# Patient Record
Sex: Female | Born: 1960 | Race: White | Hispanic: No | Marital: Married | Smoking: Never smoker
Health system: Southern US, Community
[De-identification: ages and names within clinical notes are randomized; demographics above are authoritative.]

## PROBLEM LIST (undated history)

## (undated) DIAGNOSIS — I10 Essential (primary) hypertension: Secondary | ICD-10-CM

## (undated) DIAGNOSIS — R569 Unspecified convulsions: Secondary | ICD-10-CM

## (undated) DIAGNOSIS — D689 Coagulation defect, unspecified: Secondary | ICD-10-CM

## (undated) DIAGNOSIS — M1990 Unspecified osteoarthritis, unspecified site: Secondary | ICD-10-CM

## (undated) DIAGNOSIS — K449 Diaphragmatic hernia without obstruction or gangrene: Secondary | ICD-10-CM

## (undated) DIAGNOSIS — T7840XA Allergy, unspecified, initial encounter: Secondary | ICD-10-CM

## (undated) DIAGNOSIS — F419 Anxiety disorder, unspecified: Secondary | ICD-10-CM

## (undated) DIAGNOSIS — K219 Gastro-esophageal reflux disease without esophagitis: Secondary | ICD-10-CM

## (undated) HISTORY — DX: Essential (primary) hypertension: I10

## (undated) HISTORY — DX: Gastro-esophageal reflux disease without esophagitis: K21.9

## (undated) HISTORY — DX: Unspecified osteoarthritis, unspecified site: M19.90

## (undated) HISTORY — DX: Unspecified convulsions: R56.9

## (undated) HISTORY — PX: UPPER GASTROINTESTINAL ENDOSCOPY: SHX188

## (undated) HISTORY — DX: Diaphragmatic hernia without obstruction or gangrene: K44.9

## (undated) HISTORY — DX: Anxiety disorder, unspecified: F41.9

## (undated) HISTORY — DX: Coagulation defect, unspecified: D68.9

## (undated) HISTORY — PX: COLONOSCOPY: SHX174

## (undated) HISTORY — DX: Allergy, unspecified, initial encounter: T78.40XA

---

## 1997-09-13 HISTORY — PX: BACK SURGERY: SHX140

## 2009-01-13 ENCOUNTER — Encounter: Admission: RE | Admit: 2009-01-13 | Discharge: 2009-01-13 | Payer: Self-pay | Admitting: Obstetrics

## 2009-07-18 ENCOUNTER — Ambulatory Visit: Payer: Self-pay | Admitting: Genetic Counselor

## 2009-08-25 ENCOUNTER — Ambulatory Visit: Payer: Self-pay | Admitting: Genetic Counselor

## 2009-09-13 HISTORY — PX: ABDOMINAL HYSTERECTOMY: SHX81

## 2010-02-11 ENCOUNTER — Encounter (INDEPENDENT_AMBULATORY_CARE_PROVIDER_SITE_OTHER): Payer: Self-pay | Admitting: Obstetrics

## 2010-02-11 ENCOUNTER — Inpatient Hospital Stay (HOSPITAL_COMMUNITY): Admission: RE | Admit: 2010-02-11 | Discharge: 2010-02-13 | Payer: Self-pay | Admitting: Obstetrics

## 2010-03-09 ENCOUNTER — Encounter: Admission: RE | Admit: 2010-03-09 | Discharge: 2010-03-09 | Payer: Self-pay | Admitting: Obstetrics

## 2010-11-30 LAB — BASIC METABOLIC PANEL
CO2: 26 mEq/L (ref 19–32)
Calcium: 9.9 mg/dL (ref 8.4–10.5)
Chloride: 105 mEq/L (ref 96–112)
Glucose, Bld: 92 mg/dL (ref 70–99)

## 2010-11-30 LAB — CBC
HCT: 34.6 % — ABNORMAL LOW (ref 36.0–46.0)
HCT: 43.1 % (ref 36.0–46.0)
Hemoglobin: 12.2 g/dL (ref 12.0–15.0)
Hemoglobin: 14.9 g/dL (ref 12.0–15.0)
MCHC: 35.2 g/dL (ref 30.0–36.0)
MCHC: 34.5 g/dL (ref 30.0–36.0)
MCV: 87.8 fL (ref 78.0–100.0)
MCV: 87.7 fL (ref 78.0–100.0)
Platelets: 213 10*3/uL (ref 150–400)
Platelets: 237 10*3/uL (ref 150–400)
RBC: 3.94 MIL/uL (ref 3.87–5.11)
RBC: 4.92 MIL/uL (ref 3.87–5.11)
RDW: 12.4 % (ref 11.5–15.5)

## 2010-11-30 LAB — PREGNANCY, URINE: Preg Test, Ur: NEGATIVE

## 2010-11-30 LAB — TYPE AND SCREEN
ABO/RH(D): A NEG
Antibody Screen: NEGATIVE

## 2010-11-30 LAB — ABO/RH: ABO/RH(D): A NEG

## 2011-02-02 ENCOUNTER — Other Ambulatory Visit: Payer: Self-pay | Admitting: Obstetrics

## 2011-02-02 DIAGNOSIS — Z1231 Encounter for screening mammogram for malignant neoplasm of breast: Secondary | ICD-10-CM

## 2011-02-24 ENCOUNTER — Other Ambulatory Visit: Payer: Self-pay | Admitting: Internal Medicine

## 2011-02-24 ENCOUNTER — Ambulatory Visit
Admission: RE | Admit: 2011-02-24 | Discharge: 2011-02-24 | Disposition: A | Discharge status: Home / Self Care | Payer: BC Managed Care – PPO | Source: Ambulatory Visit | Attending: Internal Medicine | Admitting: Internal Medicine

## 2011-02-24 DIAGNOSIS — R112 Nausea with vomiting, unspecified: Secondary | ICD-10-CM

## 2011-03-18 ENCOUNTER — Ambulatory Visit
Admission: RE | Admit: 2011-03-18 | Discharge: 2011-03-18 | Disposition: A | Discharge status: Home / Self Care | Payer: BC Managed Care – PPO | Source: Ambulatory Visit | Attending: Obstetrics | Admitting: Obstetrics

## 2011-03-18 DIAGNOSIS — Z1231 Encounter for screening mammogram for malignant neoplasm of breast: Secondary | ICD-10-CM

## 2012-01-03 ENCOUNTER — Encounter: Payer: Self-pay | Admitting: Internal Medicine

## 2012-02-10 ENCOUNTER — Ambulatory Visit (AMBULATORY_SURGERY_CENTER): Payer: BC Managed Care – PPO | Admitting: *Deleted

## 2012-02-10 ENCOUNTER — Encounter: Payer: Self-pay | Admitting: Internal Medicine

## 2012-02-10 VITALS — Ht 65.0 in | Wt 152.0 lb

## 2012-02-10 DIAGNOSIS — Z1211 Encounter for screening for malignant neoplasm of colon: Secondary | ICD-10-CM

## 2012-02-10 MED ORDER — PEG-KCL-NACL-NASULF-NA ASC-C 100 G PO SOLR: ORAL | Status: DC

## 2012-02-15 ENCOUNTER — Other Ambulatory Visit: Payer: Self-pay | Admitting: Internal Medicine

## 2012-02-15 DIAGNOSIS — Z1231 Encounter for screening mammogram for malignant neoplasm of breast: Secondary | ICD-10-CM

## 2012-02-21 ENCOUNTER — Encounter: Payer: Self-pay | Admitting: Internal Medicine

## 2012-02-21 ENCOUNTER — Ambulatory Visit (AMBULATORY_SURGERY_CENTER): Payer: BC Managed Care – PPO | Admitting: Internal Medicine

## 2012-02-21 VITALS — BP 182/86 | HR 77 | Temp 98.9°F | Resp 20 | Ht 65.0 in | Wt 152.0 lb

## 2012-02-21 DIAGNOSIS — D126 Benign neoplasm of colon, unspecified: Secondary | ICD-10-CM

## 2012-02-21 DIAGNOSIS — Z1211 Encounter for screening for malignant neoplasm of colon: Secondary | ICD-10-CM

## 2012-02-21 MED ORDER — SODIUM CHLORIDE 0.9 % IV SOLN: 500.0000 mL | INTRAVENOUS | Status: DC

## 2012-02-21 NOTE — Progress Notes (Signed)
Patient did not have preoperative order for IV antibiotic SSI prophylaxis. (G8918)  Patient did not experience any of the following events: a burn prior to discharge; a fall within the facility; wrong site/side/patient/procedure/implant event; or a hospital transfer or hospital admission upon discharge from the facility. (G8907)  

## 2012-02-21 NOTE — Op Note (Signed)
[facility] [address] [place], [place]  [address]  COLONOSCOPY PROCEDURE REPORT  PATIENT:  Melinda, Wheeler  MR#:  [id number] BIRTHDATE:  20-Dec-1960, 51 yrs. old  GENDER:  female ENDOSCOPIST:  Wilhemina Bonito. Eda Keys, MD REF. BY:  Chilton Greathouse, M.D. PROCEDURE DATE:  02/21/2012 PROCEDURE:  Colonoscopy with snare polypectomy x 1 ASA CLASS:  Class II INDICATIONS:  Routine Risk Screening MEDICATIONS:   MAC sedation, administered by CRNA, propofol (Diprivan) 300 mg IV  DESCRIPTION OF PROCEDURE:   After the risks benefits and alternatives of the procedure were thoroughly explained, informed consent was obtained.  Digital rectal exam was performed and revealed no abnormalities.   The LB CF-H180AL [id number] endoscope was introduced through the anus and advanced to the cecum, which was identified by both the appendix and ileocecal valve, without limitations.  The quality of the prep was excellent, using MoviPrep.  The instrument was then slowly withdrawn as the colon was fully examined. <<PROCEDUREIMAGES>>  FINDINGS:  A diminutive polyp was found in the ascending colon and snared without cautery. Retrieval was successful. Otherwise normal colonoscopy without other polyps, masses, vascular ectasias, or inflammatory changes.   Retroflexed views in the rectum revealed no abnormalities.    The time to cecum = 3:35  minutes. The scope was then withdrawn in 12:58  minutes from the cecum and the procedure completed.  COMPLICATIONS:  None  ENDOSCOPIC IMPRESSION: 1) Diminutive polyp in the ascending colon - removed 2) Otherwise normal colonoscopy  RECOMMENDATIONS: 1) Repeat colonoscopy in 5 years if polyp adenomatous; otherwise 10 years  ______________________________ Wilhemina Bonito. Eda Keys, MD  CC:  Chilton Greathouse, MD;  The Patient  n. eSIGNED:   Wilhemina Bonito. Eda Keys at 02/21/2012 11:32 AM  Christy Sartorius, [phone]

## 2012-02-21 NOTE — Patient Instructions (Signed)
YOU HAD AN ENDOSCOPIC PROCEDURE TODAY AT THE [facility]: Refer to the procedure report that was given to you for any specific questions about what was found during the examination.  If the procedure report does not answer your questions, please call your gastroenterologist to clarify.  If you requested that your care partner not be given the details of your procedure findings, then the procedure report has been included in a sealed envelope for you to review at your convenience later.  YOU SHOULD EXPECT: Some feelings of bloating in the abdomen. Passage of more gas than usual.  Walking can help get rid of the air that was put into your GI tract during the procedure and reduce the bloating. If you had a lower endoscopy (such as a colonoscopy or flexible sigmoidoscopy) you may notice spotting of blood in your stool or on the toilet paper. If you underwent a bowel prep for your procedure, then you may not have a normal bowel movement for a few days.  DIET: Your first meal following the procedure should be a light meal and then it is ok to progress to your normal diet.  A half-sandwich or bowl of soup is an example of a good first meal.  Heavy or fried foods are harder to digest and may make you feel nauseous or bloated.  Likewise meals heavy in dairy and vegetables can cause extra gas to form and this can also increase the bloating.  Drink plenty of fluids but you should avoid alcoholic beverages for 24 hours.  ACTIVITY: Your care partner should take you home directly after the procedure.  You should plan to take it easy, moving slowly for the rest of the day.  You can resume normal activity the day after the procedure however you should NOT DRIVE or use heavy machinery for 24 hours (because of the sedation medicines used during the test).    SYMPTOMS TO REPORT IMMEDIATELY: A gastroenterologist can be reached at any hour.  During normal business hours, 8:30 AM to 5:00 PM Monday through Friday,  call [phone].  After hours and on weekends, please call the GI answering service at [phone] who will take a message and have the physician on call contact you.   Following lower endoscopy (colonoscopy or flexible sigmoidoscopy):  Excessive amounts of blood in the stool  Significant tenderness or worsening of abdominal pains  Swelling of the abdomen that is new, acute  Fever of 100F or higher   FOLLOW UP: If any biopsies were taken you will be contacted by phone or by letter within the next 1-3 weeks.  Call your gastroenterologist if you have not heard about the biopsies in 3 weeks.  Our staff will call the home number listed on your records the next business day following your procedure to check on you and address any questions or concerns that you may have at that time regarding the information given to you following your procedure. This is a courtesy call and so if there is no answer at the home number and we have not heard from you through the emergency physician on call, we will assume that you have returned to your regular daily activities without incident.  SIGNATURES/CONFIDENTIALITY: You and/or your care partner have signed paperwork which will be entered into your electronic medical record.  These signatures attest to the fact that that the information above on your After Visit Summary has been reviewed and is understood.  Full responsibility of the confidentiality of   this discharge information lies with you and/or your care-partner.   INFORMATION ON POLYPS GIVEN TO YOU TODAY 

## 2012-02-22 ENCOUNTER — Telehealth: Payer: Self-pay | Admitting: *Deleted

## 2012-02-22 NOTE — Telephone Encounter (Signed)
  Follow up Call-  Call back number 02/21/2012  Post procedure Call Back phone  # [phone]  Permission to leave phone message Yes     Left message on answering machine to call us back if having problems or has any questions.

## 2012-02-25 ENCOUNTER — Encounter: Payer: Self-pay | Admitting: Internal Medicine

## 2012-03-20 ENCOUNTER — Ambulatory Visit
Admission: RE | Admit: 2012-03-20 | Discharge: 2012-03-20 | Disposition: A | Discharge status: Home / Self Care | Payer: BC Managed Care – PPO | Source: Ambulatory Visit | Attending: Internal Medicine | Admitting: Internal Medicine

## 2012-03-20 DIAGNOSIS — Z1231 Encounter for screening mammogram for malignant neoplasm of breast: Secondary | ICD-10-CM

## 2013-02-15 ENCOUNTER — Other Ambulatory Visit: Payer: Self-pay

## 2013-02-15 DIAGNOSIS — Z1231 Encounter for screening mammogram for malignant neoplasm of breast: Secondary | ICD-10-CM

## 2013-03-21 ENCOUNTER — Ambulatory Visit
Admission: RE | Admit: 2013-03-21 | Discharge: 2013-03-21 | Disposition: A | Discharge status: Home / Self Care | Payer: BC Managed Care – PPO | Source: Ambulatory Visit

## 2013-03-21 DIAGNOSIS — Z1231 Encounter for screening mammogram for malignant neoplasm of breast: Secondary | ICD-10-CM

## 2013-04-17 ENCOUNTER — Encounter (INDEPENDENT_AMBULATORY_CARE_PROVIDER_SITE_OTHER): Payer: BC Managed Care – PPO | Admitting: *Deleted

## 2013-04-17 DIAGNOSIS — M79609 Pain in unspecified limb: Secondary | ICD-10-CM

## 2014-02-18 ENCOUNTER — Other Ambulatory Visit: Payer: Self-pay

## 2014-02-18 DIAGNOSIS — Z1231 Encounter for screening mammogram for malignant neoplasm of breast: Secondary | ICD-10-CM

## 2014-03-29 ENCOUNTER — Ambulatory Visit
Admission: RE | Admit: 2014-03-29 | Discharge: 2014-03-29 | Disposition: A | Discharge status: Home / Self Care | Payer: BC Managed Care – PPO | Source: Ambulatory Visit

## 2014-03-29 DIAGNOSIS — Z1231 Encounter for screening mammogram for malignant neoplasm of breast: Secondary | ICD-10-CM

## 2014-08-02 ENCOUNTER — Other Ambulatory Visit (HOSPITAL_COMMUNITY): Payer: Self-pay | Admitting: Internal Medicine

## 2014-08-02 ENCOUNTER — Ambulatory Visit (HOSPITAL_COMMUNITY)
Admission: RE | Admit: 2014-08-02 | Discharge: 2014-08-02 | Disposition: A | Discharge status: Home / Self Care | Payer: BC Managed Care – PPO | Source: Ambulatory Visit | Attending: Vascular Surgery | Admitting: Vascular Surgery

## 2014-08-02 DIAGNOSIS — I8290 Acute embolism and thrombosis of unspecified vein: Secondary | ICD-10-CM | POA: Insufficient documentation

## 2014-08-02 DIAGNOSIS — M79605 Pain in left leg: Secondary | ICD-10-CM | POA: Insufficient documentation

## 2014-10-16 ENCOUNTER — Ambulatory Visit (HOSPITAL_COMMUNITY)
Admission: RE | Admit: 2014-10-16 | Discharge: 2014-10-16 | Disposition: A | Discharge status: Home / Self Care | Payer: BLUE CROSS/BLUE SHIELD | Source: Ambulatory Visit | Attending: Vascular Surgery | Admitting: Vascular Surgery

## 2014-10-16 ENCOUNTER — Other Ambulatory Visit (HOSPITAL_COMMUNITY): Payer: Self-pay | Admitting: Internal Medicine

## 2014-10-16 DIAGNOSIS — Z86718 Personal history of other venous thrombosis and embolism: Secondary | ICD-10-CM

## 2014-10-16 DIAGNOSIS — M79604 Pain in right leg: Secondary | ICD-10-CM | POA: Diagnosis not present

## 2014-10-16 DIAGNOSIS — M79605 Pain in left leg: Secondary | ICD-10-CM

## 2015-02-24 ENCOUNTER — Other Ambulatory Visit: Payer: Self-pay

## 2015-02-24 DIAGNOSIS — Z1231 Encounter for screening mammogram for malignant neoplasm of breast: Secondary | ICD-10-CM

## 2015-03-25 ENCOUNTER — Encounter: Payer: Self-pay | Admitting: Genetic Counselor

## 2015-04-04 ENCOUNTER — Ambulatory Visit
Admission: RE | Admit: 2015-04-04 | Discharge: 2015-04-04 | Disposition: A | Discharge status: Home / Self Care | Payer: BLUE CROSS/BLUE SHIELD | Source: Ambulatory Visit

## 2015-04-04 DIAGNOSIS — Z1231 Encounter for screening mammogram for malignant neoplasm of breast: Secondary | ICD-10-CM

## 2015-04-10 ENCOUNTER — Ambulatory Visit (INDEPENDENT_AMBULATORY_CARE_PROVIDER_SITE_OTHER): Payer: BLUE CROSS/BLUE SHIELD

## 2015-04-10 ENCOUNTER — Encounter: Payer: Self-pay | Admitting: Podiatry

## 2015-04-10 ENCOUNTER — Ambulatory Visit (INDEPENDENT_AMBULATORY_CARE_PROVIDER_SITE_OTHER): Payer: BLUE CROSS/BLUE SHIELD | Admitting: Podiatry

## 2015-04-10 VITALS — BP 148/81 | HR 75 | Resp 16

## 2015-04-10 DIAGNOSIS — Q6651 Congenital pes planus, right foot: Secondary | ICD-10-CM

## 2015-04-10 DIAGNOSIS — M779 Enthesopathy, unspecified: Secondary | ICD-10-CM

## 2015-04-10 DIAGNOSIS — M722 Plantar fascial fibromatosis: Secondary | ICD-10-CM

## 2015-04-10 MED ORDER — MELOXICAM 15 MG PO TABS: 15.0000 mg | ORAL_TABLET | Freq: Every day | ORAL | Status: DC

## 2015-04-10 MED ORDER — METHYLPREDNISOLONE 4 MG PO TBPK: ORAL_TABLET | ORAL | Status: DC

## 2015-04-10 NOTE — Progress Notes (Signed)
   Subjective:    Patient ID: Melinda Wheeler, female    DOB: August 20, 1961, 54 y.o.   MRN: [id number]  HPI Comments: "I have pain on the inside of my foot"  Patient c/o aching, burning medial foot right for few months. AM pain.Tried OTC insoles, ice, massaging. Worse at end of day.      Review of Systems  All other systems reviewed and are negative.      Objective:   Physical Exam: I have reviewed her past medical history medications allergies surgery social history and review of systems. Pulses are strongly palpable. Neurologic sensorium is intact. Deep tendon reflexes are brisk and equal bilateral. Muscle strength was 5 over 5 dorsiflexion plantar flexors inverters and everters on the musculature appears to be intact. He does have pain on palpation of the posterior tibial tendon as it courses beneath the medial malleolus extending to the navicular tuberosity. He also has some tenderness plantar aspect of the navicular tuberosity area. Radiograph does not demonstrate any type of osseus abnormalities in this area. Orthopedic evaluation demonstrate all joints distal to the ankle for range of motion without crepitation.        Assessment & Plan:  Assessment: Pes planus right. Injected small amount of dexamethasone to the point of maximal tenderness of the right posterior tibial tendon never injecting directly into the tendon itself. Also provided her with arch cookies. Started her on a Medrol Dosepak to be followed by meloxicam. I will follow up with her in the near future and consider MRI if necessary.

## 2015-05-08 ENCOUNTER — Ambulatory Visit: Payer: BLUE CROSS/BLUE SHIELD | Admitting: Podiatry

## 2016-01-15 DIAGNOSIS — J302 Other seasonal allergic rhinitis: Secondary | ICD-10-CM | POA: Diagnosis not present

## 2016-01-15 DIAGNOSIS — Z6826 Body mass index (BMI) 26.0-26.9, adult: Secondary | ICD-10-CM | POA: Diagnosis not present

## 2016-01-15 DIAGNOSIS — I10 Essential (primary) hypertension: Secondary | ICD-10-CM | POA: Diagnosis not present

## 2016-01-15 DIAGNOSIS — J069 Acute upper respiratory infection, unspecified: Secondary | ICD-10-CM | POA: Diagnosis not present

## 2016-01-28 DIAGNOSIS — M5137 Other intervertebral disc degeneration, lumbosacral region: Secondary | ICD-10-CM | POA: Diagnosis not present

## 2016-01-28 DIAGNOSIS — M2141 Flat foot [pes planus] (acquired), right foot: Secondary | ICD-10-CM | POA: Diagnosis not present

## 2016-01-28 DIAGNOSIS — M9903 Segmental and somatic dysfunction of lumbar region: Secondary | ICD-10-CM | POA: Diagnosis not present

## 2016-01-28 DIAGNOSIS — M2142 Flat foot [pes planus] (acquired), left foot: Secondary | ICD-10-CM | POA: Diagnosis not present

## 2016-03-22 ENCOUNTER — Other Ambulatory Visit: Payer: Self-pay | Admitting: Internal Medicine

## 2016-03-22 DIAGNOSIS — Z1231 Encounter for screening mammogram for malignant neoplasm of breast: Secondary | ICD-10-CM

## 2016-04-05 ENCOUNTER — Ambulatory Visit
Admission: RE | Admit: 2016-04-05 | Discharge: 2016-04-05 | Disposition: A | Discharge status: Home / Self Care | Payer: BLUE CROSS/BLUE SHIELD | Source: Ambulatory Visit | Attending: Internal Medicine | Admitting: Internal Medicine

## 2016-04-05 DIAGNOSIS — Z1231 Encounter for screening mammogram for malignant neoplasm of breast: Secondary | ICD-10-CM

## 2016-04-24 DIAGNOSIS — M25561 Pain in right knee: Secondary | ICD-10-CM | POA: Diagnosis not present

## 2016-04-28 DIAGNOSIS — M25561 Pain in right knee: Secondary | ICD-10-CM | POA: Diagnosis not present

## 2016-05-20 DIAGNOSIS — M791 Myalgia: Secondary | ICD-10-CM | POA: Diagnosis not present

## 2016-05-20 DIAGNOSIS — E119 Type 2 diabetes mellitus without complications: Secondary | ICD-10-CM | POA: Diagnosis not present

## 2016-05-20 DIAGNOSIS — R569 Unspecified convulsions: Secondary | ICD-10-CM | POA: Diagnosis not present

## 2016-05-20 DIAGNOSIS — I10 Essential (primary) hypertension: Secondary | ICD-10-CM | POA: Diagnosis not present

## 2016-06-21 DIAGNOSIS — D485 Neoplasm of uncertain behavior of skin: Secondary | ICD-10-CM | POA: Diagnosis not present

## 2016-06-21 DIAGNOSIS — L720 Epidermal cyst: Secondary | ICD-10-CM | POA: Diagnosis not present

## 2016-06-21 DIAGNOSIS — E119 Type 2 diabetes mellitus without complications: Secondary | ICD-10-CM | POA: Diagnosis not present

## 2016-06-21 DIAGNOSIS — I10 Essential (primary) hypertension: Secondary | ICD-10-CM | POA: Diagnosis not present

## 2016-06-28 DIAGNOSIS — R569 Unspecified convulsions: Secondary | ICD-10-CM | POA: Diagnosis not present

## 2016-06-28 DIAGNOSIS — Z23 Encounter for immunization: Secondary | ICD-10-CM | POA: Diagnosis not present

## 2016-06-28 DIAGNOSIS — E119 Type 2 diabetes mellitus without complications: Secondary | ICD-10-CM | POA: Diagnosis not present

## 2016-06-28 DIAGNOSIS — I10 Essential (primary) hypertension: Secondary | ICD-10-CM | POA: Diagnosis not present

## 2016-06-28 DIAGNOSIS — Z1389 Encounter for screening for other disorder: Secondary | ICD-10-CM | POA: Diagnosis not present

## 2016-06-28 DIAGNOSIS — I8290 Acute embolism and thrombosis of unspecified vein: Secondary | ICD-10-CM | POA: Diagnosis not present

## 2016-06-28 DIAGNOSIS — Z0000 Encounter for general adult medical examination without abnormal findings: Secondary | ICD-10-CM | POA: Diagnosis not present

## 2016-09-21 DIAGNOSIS — M859 Disorder of bone density and structure, unspecified: Secondary | ICD-10-CM | POA: Diagnosis not present

## 2016-10-21 ENCOUNTER — Ambulatory Visit (INDEPENDENT_AMBULATORY_CARE_PROVIDER_SITE_OTHER): Payer: BLUE CROSS/BLUE SHIELD

## 2016-10-21 ENCOUNTER — Ambulatory Visit (INDEPENDENT_AMBULATORY_CARE_PROVIDER_SITE_OTHER): Payer: BLUE CROSS/BLUE SHIELD | Admitting: Podiatry

## 2016-10-21 ENCOUNTER — Encounter: Payer: Self-pay | Admitting: Podiatry

## 2016-10-21 DIAGNOSIS — S92515A Nondisplaced fracture of proximal phalanx of left lesser toe(s), initial encounter for closed fracture: Secondary | ICD-10-CM | POA: Diagnosis not present

## 2016-10-21 DIAGNOSIS — S99922A Unspecified injury of left foot, initial encounter: Secondary | ICD-10-CM | POA: Diagnosis not present

## 2016-10-22 ENCOUNTER — Telehealth: Payer: Self-pay | Admitting: *Deleted

## 2016-10-22 NOTE — Telephone Encounter (Addendum)
Pt states she was seen yesterday and has a fracture of her left foot, was offered walking boot, but felt she had a shoe at home that would work, and she now knows she does not. Pt would like to pick up the boot today. Left message informing pt she would be able to pick up a boot and be fitted in the [place] office before and then again later if need be I would call to have her meet me here for fitting once returned from outing. Pt presented to office for fitting of Cam Walker. I fitted with boot and instructed pt in application of Cam Walker.

## 2016-10-24 NOTE — Progress Notes (Signed)
She presents today with a chief complaint of pain to the fourth digit and metatarsophalangeal joint of the left foot. States that she remembers stubbing the toe but states that it was still hurting a little bit before then.  Objective: Vital signs are stable she is alert and oriented 3 reviewed her past medical history medications allergies surgeries and social history. She has tenderness on palpation of the base of the fourth digit toward the third interdigital space. Radial dressing today demonstrate a nondisplaced fracture of the base of the fourth toe. She also has tenderness on palpation to the third interdigital space. She is pain on end range of motion of the third metatarsophalangeal joint.  Assessment: Nondisplaced fracture of the base of the fourth digit of the left foot can rule out capsulitis or neuroma.  Plan: Demonstrated to her how to place compression dressing. She will continue to so follow up with me in 4 weeks for another x-ray. We will reassess the interspace at that time.

## 2016-11-15 DIAGNOSIS — D225 Melanocytic nevi of trunk: Secondary | ICD-10-CM | POA: Diagnosis not present

## 2016-11-15 DIAGNOSIS — D1801 Hemangioma of skin and subcutaneous tissue: Secondary | ICD-10-CM | POA: Diagnosis not present

## 2016-11-15 DIAGNOSIS — D224 Melanocytic nevi of scalp and neck: Secondary | ICD-10-CM | POA: Diagnosis not present

## 2016-11-15 DIAGNOSIS — D2261 Melanocytic nevi of right upper limb, including shoulder: Secondary | ICD-10-CM | POA: Diagnosis not present

## 2017-02-28 ENCOUNTER — Other Ambulatory Visit: Payer: Self-pay | Admitting: Internal Medicine

## 2017-02-28 DIAGNOSIS — Z1231 Encounter for screening mammogram for malignant neoplasm of breast: Secondary | ICD-10-CM

## 2017-04-06 ENCOUNTER — Ambulatory Visit
Admission: RE | Admit: 2017-04-06 | Discharge: 2017-04-06 | Disposition: A | Discharge status: Home / Self Care | Payer: BLUE CROSS/BLUE SHIELD | Source: Ambulatory Visit | Attending: Internal Medicine | Admitting: Internal Medicine

## 2017-04-06 DIAGNOSIS — Z1231 Encounter for screening mammogram for malignant neoplasm of breast: Secondary | ICD-10-CM

## 2017-07-05 DIAGNOSIS — M859 Disorder of bone density and structure, unspecified: Secondary | ICD-10-CM | POA: Diagnosis not present

## 2017-07-05 DIAGNOSIS — Z0000 Encounter for general adult medical examination without abnormal findings: Secondary | ICD-10-CM | POA: Diagnosis not present

## 2017-07-05 DIAGNOSIS — I10 Essential (primary) hypertension: Secondary | ICD-10-CM | POA: Diagnosis not present

## 2017-07-05 DIAGNOSIS — E119 Type 2 diabetes mellitus without complications: Secondary | ICD-10-CM | POA: Diagnosis not present

## 2017-07-12 DIAGNOSIS — Z0000 Encounter for general adult medical examination without abnormal findings: Secondary | ICD-10-CM | POA: Diagnosis not present

## 2017-07-12 DIAGNOSIS — R945 Abnormal results of liver function studies: Secondary | ICD-10-CM | POA: Diagnosis not present

## 2017-07-12 DIAGNOSIS — Z23 Encounter for immunization: Secondary | ICD-10-CM | POA: Diagnosis not present

## 2017-07-12 DIAGNOSIS — E7849 Other hyperlipidemia: Secondary | ICD-10-CM | POA: Diagnosis not present

## 2017-07-12 DIAGNOSIS — Z1389 Encounter for screening for other disorder: Secondary | ICD-10-CM | POA: Diagnosis not present

## 2017-07-12 DIAGNOSIS — E119 Type 2 diabetes mellitus without complications: Secondary | ICD-10-CM | POA: Diagnosis not present

## 2017-07-12 DIAGNOSIS — I10 Essential (primary) hypertension: Secondary | ICD-10-CM | POA: Diagnosis not present

## 2017-09-30 ENCOUNTER — Other Ambulatory Visit: Payer: Self-pay

## 2017-09-30 ENCOUNTER — Emergency Department (HOSPITAL_COMMUNITY)
Admission: EM | Admit: 2017-09-30 | Discharge: 2017-10-01 | Discharge status: Against Medical Advice | Payer: BLUE CROSS/BLUE SHIELD | Attending: Emergency Medicine | Admitting: Emergency Medicine

## 2017-09-30 ENCOUNTER — Encounter (HOSPITAL_COMMUNITY): Payer: Self-pay | Admitting: *Deleted

## 2017-09-30 ENCOUNTER — Emergency Department (HOSPITAL_COMMUNITY): Payer: BLUE CROSS/BLUE SHIELD

## 2017-09-30 DIAGNOSIS — Z5321 Procedure and treatment not carried out due to patient leaving prior to being seen by health care provider: Secondary | ICD-10-CM | POA: Diagnosis not present

## 2017-09-30 DIAGNOSIS — R072 Precordial pain: Secondary | ICD-10-CM | POA: Diagnosis not present

## 2017-09-30 DIAGNOSIS — R079 Chest pain, unspecified: Secondary | ICD-10-CM | POA: Diagnosis not present

## 2017-09-30 DIAGNOSIS — I10 Essential (primary) hypertension: Secondary | ICD-10-CM | POA: Diagnosis not present

## 2017-09-30 DIAGNOSIS — R509 Fever, unspecified: Secondary | ICD-10-CM | POA: Diagnosis not present

## 2017-09-30 DIAGNOSIS — R42 Dizziness and giddiness: Secondary | ICD-10-CM | POA: Diagnosis not present

## 2017-09-30 LAB — BASIC METABOLIC PANEL
Anion gap: 11 (ref 5–15)
BUN: 16 mg/dL (ref 6–20)
CHLORIDE: 105 mmol/L (ref 101–111)
CO2: 22 mmol/L (ref 22–32)
CREATININE: 1.07 mg/dL — AB (ref 0.44–1.00)
Calcium: 9.8 mg/dL (ref 8.9–10.3)
GFR calc non Af Amer: 57 mL/min — ABNORMAL LOW (ref >60)
Glucose, Bld: 138 mg/dL — ABNORMAL HIGH (ref 65–99)
Potassium: 4 mmol/L (ref 3.5–5.1)
Sodium: 138 mmol/L (ref 135–145)

## 2017-09-30 LAB — CBC
HEMATOCRIT: 40.9 % (ref 36.0–46.0)
Hemoglobin: 13.7 g/dL (ref 12.0–15.0)
MCH: 29.1 pg (ref 26.0–34.0)
MCHC: 33.5 g/dL (ref 30.0–36.0)
MCV: 87 fL (ref 78.0–100.0)
Platelets: 266 10*3/uL (ref 150–400)
RBC: 4.7 MIL/uL (ref 3.87–5.11)
RDW: 12.1 % (ref 11.5–15.5)
WBC: 8.2 10*3/uL (ref 4.0–10.5)

## 2017-09-30 LAB — I-STAT TROPONIN, ED: Troponin i, poc: 0 ng/mL (ref 0.00–0.08)

## 2017-09-30 LAB — I-STAT BETA HCG BLOOD, ED (MC, WL, AP ONLY): I-stat hCG, quantitative: <5 m[IU]/mL (ref <5)

## 2017-09-30 NOTE — ED Triage Notes (Signed)
Pt c/o Mid to L CP onset this am that radiates to back, pt denies SOB, denies n.v.d, pt A&O x4

## 2017-10-01 NOTE — ED Notes (Signed)
Unable to locate patient in lobby, presumed to have left without being seen.

## 2017-10-04 DIAGNOSIS — K219 Gastro-esophageal reflux disease without esophagitis: Secondary | ICD-10-CM | POA: Diagnosis not present

## 2017-10-04 DIAGNOSIS — Z6827 Body mass index (BMI) 27.0-27.9, adult: Secondary | ICD-10-CM | POA: Diagnosis not present

## 2017-10-04 DIAGNOSIS — R0789 Other chest pain: Secondary | ICD-10-CM | POA: Diagnosis not present

## 2017-10-05 ENCOUNTER — Encounter: Payer: Self-pay | Admitting: Internal Medicine

## 2017-11-01 ENCOUNTER — Encounter (INDEPENDENT_AMBULATORY_CARE_PROVIDER_SITE_OTHER): Payer: Self-pay

## 2017-11-01 ENCOUNTER — Encounter: Payer: Self-pay | Admitting: Internal Medicine

## 2017-11-01 ENCOUNTER — Ambulatory Visit: Payer: BLUE CROSS/BLUE SHIELD | Admitting: Internal Medicine

## 2017-11-01 VITALS — BP 130/78 | HR 80 | Ht 65.0 in | Wt 177.0 lb

## 2017-11-01 DIAGNOSIS — K219 Gastro-esophageal reflux disease without esophagitis: Secondary | ICD-10-CM | POA: Diagnosis not present

## 2017-11-01 DIAGNOSIS — R079 Chest pain, unspecified: Secondary | ICD-10-CM

## 2017-11-01 DIAGNOSIS — E663 Overweight: Secondary | ICD-10-CM

## 2017-11-01 NOTE — Patient Instructions (Signed)

## 2017-11-01 NOTE — Progress Notes (Signed)
HISTORY OF PRESENT ILLNESS:  Melinda Wheeler is a 57 y.o. female , human resource person at [employer], who is referred by her primary care provider Dr. Dagmar Hait with chest pain, possibly GERD. I saw the patient on 1 occasion as a direct referral for screening colonoscopy June 2013. Examination was normal except for a diminutive non-adenoma which was removed. Follow-up in 10 years recommended. The patient tells me that she has had a many year history of classic GERD requiring PPI therapy for control of symptoms. She reports remote upper endoscopy approximately 15 years ago and being diagnosed with a hiatal hernia. Tells me that she had chest pain problems at that time which were treated to her hiatal hernia. More recently she has complained of breakthrough reflux symptoms as well as problems with chest pain described as pressure, most notable when lying down at night and on her left side, typically lasting a few seconds or minutes. She underwent negative cardiac workup for this chest pain. Chest pain Not affected by meals. No dysphagia. GI review of systems is otherwise negative. Her current PPI regimen is pantoprazole 40 mg in the morning with antacids at night as needed. Review of outside blood work from January 2019 finds unremarkable CBC with hemoglobin 13.7. Previous abdominal ultrasound in 2012 demonstrated no significant abnormality. No gallstones.  REVIEW OF SYSTEMS:  All non-GI ROS negative unless otherwise stated in the history of present illness except for night sweats  Past Medical History:  Diagnosis Date  . Allergy    seasonal  . Anxiety   . Asthma    seasonal  . GERD (gastroesophageal reflux disease)   . Hiatal hernia   . Hypertension   . 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 ABDOMINAL HYSTERECTOMY  2011  . Katherine  reports that  has never smoked. she has never used  smokeless tobacco. She reports that she drinks about 3.0 oz of alcohol per week. She reports that she does not use drugs.  family history includes Breast cancer in her maternal grandfather and maternal grandmother; Heart disease in her mother; Stomach cancer in her father.  Allergies  Allergen Reactions  . Codeine Rash       PHYSICAL EXAMINATION: Vital signs: BP 130/78   Pulse 80   Ht 5\' 5"  (1.651 m)   Wt 177 lb (80.3 kg)   BMI 29.45 kg/m   Constitutional: generally well-appearing, no acute distress Psychiatric: alert and oriented x3, cooperative Eyes: extraocular movements intact, anicteric, conjunctiva pink Mouth: oral pharynx moist, no lesions Neck: supple no lymphadenopathy Cardiovascular: heart regular rate and rhythm, no murmur Lungs: clear to auscultation bilaterally Abdomen: soft, nontender, nondistended, no obvious ascites, no peritoneal signs, normal bowel sounds, no organomegaly Rectal: Omitted Extremities: no clubbing, cyanosis, or lower extremity edema bilaterally Skin: no lesions on visible extremities Neuro: No focal deficits. Cranial nerves intact  ASSESSMENT:  #1. GERD. Recent exacerbation. Better control with change in medical regimen #2. Chest pain. Atypical. Negative cardiac workup. Not clear to me this is GERD or not. #3. Overweight with BMI 29.45 #4. Negative screening colonoscopy June 2013  PLAN:  #1. Reflux precautions reviewed with patient personally with attention to avoiding larger meals, late night meals, and elevation of the head of bed. We also discussed the importance of weight reduction #2. Continue pantoprazole 40 mg in the morning #3. Continue  at night antacid as needed #4. Schedule diagnostic upper endoscopy to rule out complications of chronic GERD and assess the status of her hiatal hernia.The nature of the procedure, as well as the risks, benefits, and alternatives were carefully and thoroughly reviewed with the patient. Ample time for  discussion and questions allowed. The patient understood, was satisfied, and agreed to proceed. #5. Repeat screening colonoscopy June 2023   A copy of this consultation note has been sent to Dr. Dagmar Hait

## 2017-12-01 DIAGNOSIS — M859 Disorder of bone density and structure, unspecified: Secondary | ICD-10-CM | POA: Diagnosis not present

## 2017-12-01 DIAGNOSIS — E785 Hyperlipidemia, unspecified: Secondary | ICD-10-CM | POA: Diagnosis not present

## 2017-12-02 ENCOUNTER — Encounter: Payer: Self-pay | Admitting: Internal Medicine

## 2017-12-08 DIAGNOSIS — M8580 Other specified disorders of bone density and structure, unspecified site: Secondary | ICD-10-CM | POA: Diagnosis not present

## 2017-12-08 DIAGNOSIS — E7849 Other hyperlipidemia: Secondary | ICD-10-CM | POA: Diagnosis not present

## 2017-12-08 DIAGNOSIS — E119 Type 2 diabetes mellitus without complications: Secondary | ICD-10-CM | POA: Diagnosis not present

## 2017-12-08 DIAGNOSIS — I10 Essential (primary) hypertension: Secondary | ICD-10-CM | POA: Diagnosis not present

## 2017-12-16 ENCOUNTER — Other Ambulatory Visit: Payer: Self-pay

## 2017-12-16 ENCOUNTER — Ambulatory Visit (AMBULATORY_SURGERY_CENTER): Payer: BLUE CROSS/BLUE SHIELD | Admitting: Internal Medicine

## 2017-12-16 ENCOUNTER — Encounter: Payer: Self-pay | Admitting: Internal Medicine

## 2017-12-16 VITALS — BP 129/85 | HR 66 | Temp 98.0°F | Resp 9 | Ht 65.0 in | Wt 177.0 lb

## 2017-12-16 DIAGNOSIS — K219 Gastro-esophageal reflux disease without esophagitis: Secondary | ICD-10-CM | POA: Diagnosis not present

## 2017-12-16 DIAGNOSIS — R079 Chest pain, unspecified: Secondary | ICD-10-CM

## 2017-12-16 DIAGNOSIS — K317 Polyp of stomach and duodenum: Secondary | ICD-10-CM

## 2017-12-16 MED ORDER — SODIUM CHLORIDE 0.9 % IV SOLN
500.0000 mL | Freq: Once | INTRAVENOUS | Status: AC
Start: 2017-12-16 — End: ?

## 2017-12-16 NOTE — Progress Notes (Signed)
Pt hasn't had a seizure since 2005.

## 2017-12-16 NOTE — Progress Notes (Signed)
To recovery, report to RN, VSS. 

## 2017-12-16 NOTE — Op Note (Signed)
[facility] Patient Name: Melinda Wheeler Procedure Date: 12/16/2017 10:19 AM MRN: [id number] Endoscopist: Docia Chuck. Henrene Pastor , MD Age: 57 Referring MD:  Date of Birth: Dec 08, 1960 Gender: Female Account #: [id number] Procedure:                Upper GI endoscopy, with biopsy Indications:              Esophageal reflux, Chest pain (non cardiac). The                            patient has been doing better since her office                            visit on her current medical regimen and adherence                            to reflux precautions Medicines:                Monitored Anesthesia Care Procedure:                Pre-Anesthesia Assessment:                           - Prior to the procedure, a History and Physical                            was performed, and patient medications and                            allergies were reviewed. The patient's tolerance of                            previous anesthesia was also reviewed. The risks                            and benefits of the procedure and the sedation                            options and risks were discussed with the patient.                            All questions were answered, and informed consent                            was obtained. Prior Anticoagulants: The patient has                            taken no previous anticoagulant or antiplatelet                            agents. ASA Grade Assessment: II - A patient with                            mild systemic disease. After reviewing the risks  and benefits, the patient was deemed in                            satisfactory condition to undergo the procedure.                           After obtaining informed consent, the endoscope was                            passed under direct vision. Throughout the                            procedure, the patient's blood pressure, pulse, and                            oxygen saturations were  monitored continuously. The                            Endoscope was introduced through the mouth, and                            advanced to the second part of duodenum. The upper                            GI endoscopy was accomplished without difficulty.                            The patient tolerated the procedure well. Scope In: Scope Out: Findings:                 The esophagus was normal.                           Multiple diminutive pedunculated polyps were found                            in the gastric body. These appeared like benign                            fundic gland type. Biopsies were taken with a cold                            forceps for histology, for confirmation.                           The stomach was normal, save very small sliding                            hiatal hernia.                           The examined duodenum was normal.                           The cardia and gastric fundus were normal on  retroflexion. Complications:            No immediate complications. Estimated Blood Loss:     Estimated blood loss: none. Impression:               1. Essentially normal EGD                           2. Incidental diminutive gastric polyps                           3. GERD                           4. Noncardiac chest pain. Recommendation:           1. Continue with antireflux regimen                           2. Continue current medications                           3. Return to the care of Dr. Dagmar Hait. GI follow-up as                            needed. Docia Chuck. Henrene Pastor, MD 12/16/2017 10:35:18 AM This report has been signed electronically.

## 2017-12-16 NOTE — Patient Instructions (Signed)
**   Handout given on GERD **   YOU HAD AN ENDOSCOPIC PROCEDURE TODAY AT THE [facility]:   Refer to the procedure report that was given to you for any specific questions about what was found during the examination.  If the procedure report does not answer your questions, please call your gastroenterologist to clarify.  If you requested that your care partner not be given the details of your procedure findings, then the procedure report has been included in a sealed envelope for you to review at your convenience later.  YOU SHOULD EXPECT: Some feelings of bloating in the abdomen. Passage of more gas than usual.  Walking can help get rid of the air that was put into your GI tract during the procedure and reduce the bloating. If you had a lower endoscopy (such as a colonoscopy or flexible sigmoidoscopy) you may notice spotting of blood in your stool or on the toilet paper. If you underwent a bowel prep for your procedure, you may not have a normal bowel movement for a few days.  Please Note:  You might notice some irritation and congestion in your nose or some drainage.  This is from the oxygen used during your procedure.  There is no need for concern and it should clear up in a day or so.  SYMPTOMS TO REPORT IMMEDIATELY:   Following upper endoscopy (EGD)  Vomiting of blood or coffee ground material  New chest pain or pain under the shoulder blades  Painful or persistently difficult swallowing  New shortness of breath  Fever of 100F or higher  Black, tarry-looking stools  For urgent or emergent issues, a gastroenterologist can be reached at any hour by calling [phone].   DIET:  We do recommend a small meal at first, but then you may proceed to your regular diet.  Drink plenty of fluids but you should avoid alcoholic beverages for 24 hours.  ACTIVITY:  You should plan to take it easy for the rest of today and you should NOT DRIVE or use heavy machinery until tomorrow (because  of the sedation medicines used during the test).    FOLLOW UP: Our staff will call the number listed on your records the next business day following your procedure to check on you and address any questions or concerns that you may have regarding the information given to you following your procedure. If we do not reach you, we will leave a message.  However, if you are feeling well and you are not experiencing any problems, there is no need to return our call.  We will assume that you have returned to your regular daily activities without incident.  If any biopsies were taken you will be contacted by phone or by letter within the next 1-3 weeks.  Please call us at [phone] if you have not heard about the biopsies in 3 weeks.    SIGNATURES/CONFIDENTIALITY: You and/or your care partner have signed paperwork which will be entered into your electronic medical record.  These signatures attest to the fact that that the information above on your After Visit Summary has been reviewed and is understood.  Full responsibility of the confidentiality of this discharge information lies with you and/or your care-partner. 

## 2017-12-16 NOTE — Progress Notes (Signed)
Called to room to assist during endoscopic procedure.  Patient ID and intended procedure confirmed with present staff. Received instructions for my participation in the procedure from the performing physician.  

## 2017-12-19 ENCOUNTER — Telehealth: Payer: Self-pay

## 2017-12-19 NOTE — Telephone Encounter (Signed)
  Follow up Call-  Call back number 12/16/2017  Post procedure Call Back phone  # [phone] cell  Permission to leave phone message Yes  Some recent data might be hidden     Patient questions:  Do you have a fever, pain , or abdominal swelling? No. Pain Score  0 *  Have you tolerated food without any problems? Yes.    Have you been able to return to your normal activities? Yes.    Do you have any questions about your discharge instructions: Diet   No. Medications  No. Follow up visit  No.  Do you have questions or concerns about your Care? No.  Actions: * If pain score is 4 or above: No action needed, pain <4.

## 2017-12-20 ENCOUNTER — Encounter: Payer: Self-pay | Admitting: Internal Medicine

## 2018-02-27 ENCOUNTER — Other Ambulatory Visit: Payer: Self-pay | Admitting: Internal Medicine

## 2018-02-27 DIAGNOSIS — Z1231 Encounter for screening mammogram for malignant neoplasm of breast: Secondary | ICD-10-CM

## 2018-04-07 ENCOUNTER — Ambulatory Visit
Admission: RE | Admit: 2018-04-07 | Discharge: 2018-04-07 | Disposition: A | Discharge status: Home / Self Care | Payer: BLUE CROSS/BLUE SHIELD | Source: Ambulatory Visit | Attending: Internal Medicine | Admitting: Internal Medicine

## 2018-04-07 DIAGNOSIS — Z1231 Encounter for screening mammogram for malignant neoplasm of breast: Secondary | ICD-10-CM | POA: Diagnosis not present

## 2018-07-10 DIAGNOSIS — J029 Acute pharyngitis, unspecified: Secondary | ICD-10-CM | POA: Diagnosis not present

## 2018-07-10 DIAGNOSIS — J329 Chronic sinusitis, unspecified: Secondary | ICD-10-CM | POA: Diagnosis not present

## 2018-07-10 DIAGNOSIS — R509 Fever, unspecified: Secondary | ICD-10-CM | POA: Diagnosis not present

## 2018-07-10 DIAGNOSIS — R05 Cough: Secondary | ICD-10-CM | POA: Diagnosis not present

## 2018-08-01 DIAGNOSIS — I10 Essential (primary) hypertension: Secondary | ICD-10-CM | POA: Diagnosis not present

## 2018-08-01 DIAGNOSIS — R82998 Other abnormal findings in urine: Secondary | ICD-10-CM | POA: Diagnosis not present

## 2018-08-01 DIAGNOSIS — E119 Type 2 diabetes mellitus without complications: Secondary | ICD-10-CM | POA: Diagnosis not present

## 2018-08-01 DIAGNOSIS — M859 Disorder of bone density and structure, unspecified: Secondary | ICD-10-CM | POA: Diagnosis not present

## 2018-08-04 DIAGNOSIS — Z0000 Encounter for general adult medical examination without abnormal findings: Secondary | ICD-10-CM | POA: Diagnosis not present

## 2018-08-04 DIAGNOSIS — E119 Type 2 diabetes mellitus without complications: Secondary | ICD-10-CM | POA: Diagnosis not present

## 2018-08-04 DIAGNOSIS — E7849 Other hyperlipidemia: Secondary | ICD-10-CM | POA: Diagnosis not present

## 2018-08-04 DIAGNOSIS — I10 Essential (primary) hypertension: Secondary | ICD-10-CM | POA: Diagnosis not present

## 2019-01-31 DIAGNOSIS — Z1331 Encounter for screening for depression: Secondary | ICD-10-CM | POA: Diagnosis not present

## 2019-01-31 DIAGNOSIS — Z86718 Personal history of other venous thrombosis and embolism: Secondary | ICD-10-CM | POA: Diagnosis not present

## 2019-01-31 DIAGNOSIS — E119 Type 2 diabetes mellitus without complications: Secondary | ICD-10-CM | POA: Diagnosis not present

## 2019-01-31 DIAGNOSIS — I10 Essential (primary) hypertension: Secondary | ICD-10-CM | POA: Diagnosis not present

## 2019-01-31 DIAGNOSIS — E785 Hyperlipidemia, unspecified: Secondary | ICD-10-CM | POA: Diagnosis not present

## 2019-03-06 ENCOUNTER — Other Ambulatory Visit: Payer: Self-pay | Admitting: Internal Medicine

## 2019-03-06 DIAGNOSIS — Z1231 Encounter for screening mammogram for malignant neoplasm of breast: Secondary | ICD-10-CM

## 2019-03-27 ENCOUNTER — Other Ambulatory Visit: Payer: Self-pay | Admitting: Internal Medicine

## 2019-03-27 DIAGNOSIS — K219 Gastro-esophageal reflux disease without esophagitis: Secondary | ICD-10-CM

## 2019-03-27 DIAGNOSIS — R1011 Right upper quadrant pain: Secondary | ICD-10-CM

## 2019-04-03 ENCOUNTER — Ambulatory Visit
Admission: RE | Admit: 2019-04-03 | Discharge: 2019-04-03 | Disposition: A | Discharge status: Home / Self Care | Payer: BLUE CROSS/BLUE SHIELD | Source: Ambulatory Visit | Attending: Internal Medicine | Admitting: Internal Medicine

## 2019-04-03 DIAGNOSIS — K219 Gastro-esophageal reflux disease without esophagitis: Secondary | ICD-10-CM

## 2019-04-03 DIAGNOSIS — R1011 Right upper quadrant pain: Secondary | ICD-10-CM

## 2019-04-03 DIAGNOSIS — K760 Fatty (change of) liver, not elsewhere classified: Secondary | ICD-10-CM | POA: Diagnosis not present

## 2019-04-12 ENCOUNTER — Ambulatory Visit
Admission: RE | Admit: 2019-04-12 | Discharge: 2019-04-12 | Disposition: A | Discharge status: Home / Self Care | Payer: BC Managed Care – PPO | Source: Ambulatory Visit | Attending: Internal Medicine | Admitting: Internal Medicine

## 2019-04-12 ENCOUNTER — Other Ambulatory Visit: Payer: Self-pay

## 2019-04-12 DIAGNOSIS — Z1231 Encounter for screening mammogram for malignant neoplasm of breast: Secondary | ICD-10-CM | POA: Diagnosis not present

## 2019-08-03 DIAGNOSIS — E119 Type 2 diabetes mellitus without complications: Secondary | ICD-10-CM | POA: Diagnosis not present

## 2019-08-03 DIAGNOSIS — Z0000 Encounter for general adult medical examination without abnormal findings: Secondary | ICD-10-CM | POA: Diagnosis not present

## 2019-08-03 DIAGNOSIS — M859 Disorder of bone density and structure, unspecified: Secondary | ICD-10-CM | POA: Diagnosis not present

## 2019-08-03 DIAGNOSIS — E7849 Other hyperlipidemia: Secondary | ICD-10-CM | POA: Diagnosis not present

## 2019-08-20 DIAGNOSIS — Z0000 Encounter for general adult medical examination without abnormal findings: Secondary | ICD-10-CM | POA: Diagnosis not present

## 2019-08-20 DIAGNOSIS — E119 Type 2 diabetes mellitus without complications: Secondary | ICD-10-CM | POA: Diagnosis not present

## 2019-08-20 DIAGNOSIS — Z86718 Personal history of other venous thrombosis and embolism: Secondary | ICD-10-CM | POA: Diagnosis not present

## 2019-08-20 DIAGNOSIS — Z23 Encounter for immunization: Secondary | ICD-10-CM | POA: Diagnosis not present

## 2019-08-20 DIAGNOSIS — E785 Hyperlipidemia, unspecified: Secondary | ICD-10-CM | POA: Diagnosis not present

## 2019-08-20 DIAGNOSIS — I10 Essential (primary) hypertension: Secondary | ICD-10-CM | POA: Diagnosis not present

## 2019-09-17 DIAGNOSIS — H5213 Myopia, bilateral: Secondary | ICD-10-CM | POA: Diagnosis not present

## 2019-10-10 ENCOUNTER — Other Ambulatory Visit: Payer: Self-pay | Admitting: Internal Medicine

## 2019-10-10 DIAGNOSIS — E785 Hyperlipidemia, unspecified: Secondary | ICD-10-CM

## 2020-01-18 DIAGNOSIS — M545 Low back pain: Secondary | ICD-10-CM | POA: Diagnosis not present

## 2020-01-18 DIAGNOSIS — M25561 Pain in right knee: Secondary | ICD-10-CM | POA: Diagnosis not present

## 2020-02-01 DIAGNOSIS — R945 Abnormal results of liver function studies: Secondary | ICD-10-CM | POA: Diagnosis not present

## 2020-02-01 DIAGNOSIS — M8580 Other specified disorders of bone density and structure, unspecified site: Secondary | ICD-10-CM | POA: Diagnosis not present

## 2020-02-01 DIAGNOSIS — I10 Essential (primary) hypertension: Secondary | ICD-10-CM | POA: Diagnosis not present

## 2020-02-01 DIAGNOSIS — E119 Type 2 diabetes mellitus without complications: Secondary | ICD-10-CM | POA: Diagnosis not present

## 2020-02-01 DIAGNOSIS — E785 Hyperlipidemia, unspecified: Secondary | ICD-10-CM | POA: Diagnosis not present

## 2020-02-01 DIAGNOSIS — Z86718 Personal history of other venous thrombosis and embolism: Secondary | ICD-10-CM | POA: Diagnosis not present

## 2020-02-04 ENCOUNTER — Other Ambulatory Visit: Payer: Self-pay | Admitting: Internal Medicine

## 2020-02-04 DIAGNOSIS — Z1231 Encounter for screening mammogram for malignant neoplasm of breast: Secondary | ICD-10-CM

## 2020-02-13 DIAGNOSIS — M1711 Unilateral primary osteoarthritis, right knee: Secondary | ICD-10-CM | POA: Diagnosis not present

## 2020-02-13 DIAGNOSIS — M25561 Pain in right knee: Secondary | ICD-10-CM | POA: Diagnosis not present

## 2020-04-02 DIAGNOSIS — M1711 Unilateral primary osteoarthritis, right knee: Secondary | ICD-10-CM | POA: Diagnosis not present

## 2020-04-04 DIAGNOSIS — K219 Gastro-esophageal reflux disease without esophagitis: Secondary | ICD-10-CM | POA: Diagnosis not present

## 2020-04-04 DIAGNOSIS — R945 Abnormal results of liver function studies: Secondary | ICD-10-CM | POA: Diagnosis not present

## 2020-04-04 DIAGNOSIS — R109 Unspecified abdominal pain: Secondary | ICD-10-CM | POA: Diagnosis not present

## 2020-04-04 DIAGNOSIS — E119 Type 2 diabetes mellitus without complications: Secondary | ICD-10-CM | POA: Diagnosis not present

## 2020-04-07 ENCOUNTER — Other Ambulatory Visit: Payer: Self-pay | Admitting: Internal Medicine

## 2020-04-07 DIAGNOSIS — R109 Unspecified abdominal pain: Secondary | ICD-10-CM

## 2020-04-14 ENCOUNTER — Other Ambulatory Visit: Payer: Self-pay

## 2020-04-14 ENCOUNTER — Ambulatory Visit
Admission: RE | Admit: 2020-04-14 | Discharge: 2020-04-14 | Disposition: A | Discharge status: Home / Self Care | Payer: BC Managed Care – PPO | Source: Ambulatory Visit | Attending: Internal Medicine | Admitting: Internal Medicine

## 2020-04-14 DIAGNOSIS — Z1231 Encounter for screening mammogram for malignant neoplasm of breast: Secondary | ICD-10-CM

## 2020-04-15 ENCOUNTER — Ambulatory Visit
Admission: RE | Admit: 2020-04-15 | Discharge: 2020-04-15 | Disposition: A | Discharge status: Home / Self Care | Payer: BC Managed Care – PPO | Source: Ambulatory Visit | Attending: Internal Medicine | Admitting: Internal Medicine

## 2020-04-15 DIAGNOSIS — K7460 Unspecified cirrhosis of liver: Secondary | ICD-10-CM | POA: Diagnosis not present

## 2020-04-15 DIAGNOSIS — K760 Fatty (change of) liver, not elsewhere classified: Secondary | ICD-10-CM | POA: Diagnosis not present

## 2020-04-15 DIAGNOSIS — K828 Other specified diseases of gallbladder: Secondary | ICD-10-CM | POA: Diagnosis not present

## 2020-04-15 DIAGNOSIS — R109 Unspecified abdominal pain: Secondary | ICD-10-CM

## 2020-04-25 ENCOUNTER — Other Ambulatory Visit: Payer: Self-pay | Admitting: Internal Medicine

## 2020-04-28 ENCOUNTER — Other Ambulatory Visit: Payer: Self-pay | Admitting: Internal Medicine

## 2020-04-28 DIAGNOSIS — R109 Unspecified abdominal pain: Secondary | ICD-10-CM

## 2020-04-28 DIAGNOSIS — R945 Abnormal results of liver function studies: Secondary | ICD-10-CM

## 2020-05-08 ENCOUNTER — Ambulatory Visit
Admission: RE | Admit: 2020-05-08 | Discharge: 2020-05-08 | Disposition: A | Discharge status: Home / Self Care | Payer: BC Managed Care – PPO | Source: Ambulatory Visit | Attending: Internal Medicine | Admitting: Internal Medicine

## 2020-05-08 ENCOUNTER — Other Ambulatory Visit: Payer: Self-pay

## 2020-05-08 DIAGNOSIS — R945 Abnormal results of liver function studies: Secondary | ICD-10-CM

## 2020-05-08 DIAGNOSIS — I700 Atherosclerosis of aorta: Secondary | ICD-10-CM | POA: Diagnosis not present

## 2020-05-08 DIAGNOSIS — K5730 Diverticulosis of large intestine without perforation or abscess without bleeding: Secondary | ICD-10-CM | POA: Diagnosis not present

## 2020-05-08 DIAGNOSIS — R109 Unspecified abdominal pain: Secondary | ICD-10-CM

## 2020-05-08 DIAGNOSIS — R7989 Other specified abnormal findings of blood chemistry: Secondary | ICD-10-CM | POA: Diagnosis not present

## 2020-05-08 DIAGNOSIS — K760 Fatty (change of) liver, not elsewhere classified: Secondary | ICD-10-CM | POA: Diagnosis not present

## 2020-05-13 ENCOUNTER — Other Ambulatory Visit (HOSPITAL_COMMUNITY): Payer: Self-pay | Admitting: Internal Medicine

## 2020-05-13 DIAGNOSIS — R932 Abnormal findings on diagnostic imaging of liver and biliary tract: Secondary | ICD-10-CM

## 2020-05-13 DIAGNOSIS — R945 Abnormal results of liver function studies: Secondary | ICD-10-CM

## 2020-05-16 ENCOUNTER — Ambulatory Visit (HOSPITAL_COMMUNITY)
Admission: RE | Admit: 2020-05-16 | Discharge: 2020-05-16 | Disposition: A | Discharge status: Home / Self Care | Payer: BC Managed Care – PPO | Source: Ambulatory Visit | Attending: Internal Medicine | Admitting: Internal Medicine

## 2020-05-16 ENCOUNTER — Other Ambulatory Visit: Payer: Self-pay

## 2020-05-16 DIAGNOSIS — R945 Abnormal results of liver function studies: Secondary | ICD-10-CM | POA: Diagnosis not present

## 2020-05-16 DIAGNOSIS — R932 Abnormal findings on diagnostic imaging of liver and biliary tract: Secondary | ICD-10-CM | POA: Diagnosis not present

## 2020-05-16 DIAGNOSIS — K7689 Other specified diseases of liver: Secondary | ICD-10-CM | POA: Diagnosis not present

## 2020-05-16 DIAGNOSIS — K8689 Other specified diseases of pancreas: Secondary | ICD-10-CM | POA: Diagnosis not present

## 2020-05-16 DIAGNOSIS — I700 Atherosclerosis of aorta: Secondary | ICD-10-CM | POA: Diagnosis not present

## 2020-05-16 DIAGNOSIS — K5730 Diverticulosis of large intestine without perforation or abscess without bleeding: Secondary | ICD-10-CM | POA: Diagnosis not present

## 2020-05-16 MED ORDER — GADOBUTROL 1 MMOL/ML IV SOLN
7.5000 mL | Freq: Once | INTRAVENOUS | Status: AC | PRN
  Administered 2020-05-16: 7.5 mL via INTRAVENOUS

## 2020-08-20 DIAGNOSIS — M25511 Pain in right shoulder: Secondary | ICD-10-CM | POA: Diagnosis not present

## 2020-09-10 DIAGNOSIS — E119 Type 2 diabetes mellitus without complications: Secondary | ICD-10-CM | POA: Diagnosis not present

## 2020-09-10 DIAGNOSIS — E785 Hyperlipidemia, unspecified: Secondary | ICD-10-CM | POA: Diagnosis not present

## 2020-09-15 DIAGNOSIS — Z23 Encounter for immunization: Secondary | ICD-10-CM | POA: Diagnosis not present

## 2020-09-15 DIAGNOSIS — Z0000 Encounter for general adult medical examination without abnormal findings: Secondary | ICD-10-CM | POA: Diagnosis not present

## 2020-09-15 DIAGNOSIS — I10 Essential (primary) hypertension: Secondary | ICD-10-CM | POA: Diagnosis not present

## 2020-09-17 ENCOUNTER — Other Ambulatory Visit: Payer: Self-pay | Admitting: Internal Medicine

## 2020-09-17 DIAGNOSIS — M25511 Pain in right shoulder: Secondary | ICD-10-CM | POA: Diagnosis not present

## 2020-09-17 DIAGNOSIS — R932 Abnormal findings on diagnostic imaging of liver and biliary tract: Secondary | ICD-10-CM

## 2020-10-08 ENCOUNTER — Other Ambulatory Visit: Payer: BC Managed Care – PPO

## 2020-10-28 ENCOUNTER — Other Ambulatory Visit: Payer: BC Managed Care – PPO

## 2020-11-10 ENCOUNTER — Other Ambulatory Visit: Payer: BC Managed Care – PPO

## 2020-11-12 DIAGNOSIS — M75121 Complete rotator cuff tear or rupture of right shoulder, not specified as traumatic: Secondary | ICD-10-CM | POA: Diagnosis not present

## 2020-11-12 DIAGNOSIS — M19011 Primary osteoarthritis, right shoulder: Secondary | ICD-10-CM | POA: Diagnosis not present

## 2020-11-17 DIAGNOSIS — Z23 Encounter for immunization: Secondary | ICD-10-CM | POA: Diagnosis not present

## 2020-11-29 ENCOUNTER — Ambulatory Visit
Admission: RE | Admit: 2020-11-29 | Discharge: 2020-11-29 | Disposition: A | Discharge status: Home / Self Care | Payer: Self-pay | Source: Ambulatory Visit | Attending: Internal Medicine | Admitting: Internal Medicine

## 2020-11-29 ENCOUNTER — Other Ambulatory Visit: Payer: Self-pay

## 2020-11-29 DIAGNOSIS — K760 Fatty (change of) liver, not elsewhere classified: Secondary | ICD-10-CM | POA: Diagnosis not present

## 2020-11-29 DIAGNOSIS — K7689 Other specified diseases of liver: Secondary | ICD-10-CM | POA: Diagnosis not present

## 2020-11-29 DIAGNOSIS — R932 Abnormal findings on diagnostic imaging of liver and biliary tract: Secondary | ICD-10-CM

## 2020-11-29 MED ORDER — GADOXETATE DISODIUM 0.25 MMOL/ML IV SOLN
8.0000 mL | Freq: Once | INTRAVENOUS | Status: AC | PRN
  Administered 2020-11-29: 8 mL via INTRAVENOUS

## 2020-12-02 DIAGNOSIS — S46011A Strain of muscle(s) and tendon(s) of the rotator cuff of right shoulder, initial encounter: Secondary | ICD-10-CM | POA: Diagnosis not present

## 2020-12-02 DIAGNOSIS — M75121 Complete rotator cuff tear or rupture of right shoulder, not specified as traumatic: Secondary | ICD-10-CM | POA: Diagnosis not present

## 2020-12-02 DIAGNOSIS — G8918 Other acute postprocedural pain: Secondary | ICD-10-CM | POA: Diagnosis not present

## 2020-12-02 DIAGNOSIS — S46191A Other injury of muscle, fascia and tendon of long head of biceps, right arm, initial encounter: Secondary | ICD-10-CM | POA: Diagnosis not present

## 2020-12-02 DIAGNOSIS — X58XXXA Exposure to other specified factors, initial encounter: Secondary | ICD-10-CM | POA: Diagnosis not present

## 2020-12-02 DIAGNOSIS — M19011 Primary osteoarthritis, right shoulder: Secondary | ICD-10-CM | POA: Diagnosis not present

## 2020-12-02 DIAGNOSIS — S46111A Strain of muscle, fascia and tendon of long head of biceps, right arm, initial encounter: Secondary | ICD-10-CM | POA: Diagnosis not present

## 2020-12-02 DIAGNOSIS — Y999 Unspecified external cause status: Secondary | ICD-10-CM | POA: Diagnosis not present

## 2020-12-09 DIAGNOSIS — M25511 Pain in right shoulder: Secondary | ICD-10-CM | POA: Diagnosis not present

## 2020-12-15 DIAGNOSIS — M25511 Pain in right shoulder: Secondary | ICD-10-CM | POA: Diagnosis not present

## 2020-12-15 DIAGNOSIS — Z4789 Encounter for other orthopedic aftercare: Secondary | ICD-10-CM | POA: Diagnosis not present

## 2020-12-19 DIAGNOSIS — M25511 Pain in right shoulder: Secondary | ICD-10-CM | POA: Diagnosis not present

## 2020-12-22 DIAGNOSIS — M25511 Pain in right shoulder: Secondary | ICD-10-CM | POA: Diagnosis not present

## 2020-12-24 DIAGNOSIS — M25511 Pain in right shoulder: Secondary | ICD-10-CM | POA: Diagnosis not present

## 2020-12-29 DIAGNOSIS — M25511 Pain in right shoulder: Secondary | ICD-10-CM | POA: Diagnosis not present

## 2021-01-01 DIAGNOSIS — M25511 Pain in right shoulder: Secondary | ICD-10-CM | POA: Diagnosis not present

## 2021-01-05 DIAGNOSIS — M25511 Pain in right shoulder: Secondary | ICD-10-CM | POA: Diagnosis not present

## 2021-01-08 DIAGNOSIS — M25511 Pain in right shoulder: Secondary | ICD-10-CM | POA: Diagnosis not present

## 2021-01-12 DIAGNOSIS — M25511 Pain in right shoulder: Secondary | ICD-10-CM | POA: Diagnosis not present

## 2021-01-14 DIAGNOSIS — M25511 Pain in right shoulder: Secondary | ICD-10-CM | POA: Diagnosis not present

## 2021-01-16 DIAGNOSIS — E785 Hyperlipidemia, unspecified: Secondary | ICD-10-CM | POA: Diagnosis not present

## 2021-01-16 DIAGNOSIS — I10 Essential (primary) hypertension: Secondary | ICD-10-CM | POA: Diagnosis not present

## 2021-01-16 DIAGNOSIS — E119 Type 2 diabetes mellitus without complications: Secondary | ICD-10-CM | POA: Diagnosis not present

## 2021-01-19 DIAGNOSIS — M25511 Pain in right shoulder: Secondary | ICD-10-CM | POA: Diagnosis not present

## 2021-01-21 DIAGNOSIS — M25511 Pain in right shoulder: Secondary | ICD-10-CM | POA: Diagnosis not present

## 2021-01-26 DIAGNOSIS — M25511 Pain in right shoulder: Secondary | ICD-10-CM | POA: Diagnosis not present

## 2021-01-28 DIAGNOSIS — M25511 Pain in right shoulder: Secondary | ICD-10-CM | POA: Diagnosis not present

## 2021-02-02 DIAGNOSIS — M25511 Pain in right shoulder: Secondary | ICD-10-CM | POA: Diagnosis not present

## 2021-02-04 DIAGNOSIS — D2262 Melanocytic nevi of left upper limb, including shoulder: Secondary | ICD-10-CM | POA: Diagnosis not present

## 2021-02-04 DIAGNOSIS — D225 Melanocytic nevi of trunk: Secondary | ICD-10-CM | POA: Diagnosis not present

## 2021-02-04 DIAGNOSIS — M25511 Pain in right shoulder: Secondary | ICD-10-CM | POA: Diagnosis not present

## 2021-02-04 DIAGNOSIS — L821 Other seborrheic keratosis: Secondary | ICD-10-CM | POA: Diagnosis not present

## 2021-02-04 DIAGNOSIS — L718 Other rosacea: Secondary | ICD-10-CM | POA: Diagnosis not present

## 2021-02-10 DIAGNOSIS — M25511 Pain in right shoulder: Secondary | ICD-10-CM | POA: Diagnosis not present

## 2021-02-12 DIAGNOSIS — M25511 Pain in right shoulder: Secondary | ICD-10-CM | POA: Diagnosis not present

## 2021-02-17 DIAGNOSIS — M25511 Pain in right shoulder: Secondary | ICD-10-CM | POA: Diagnosis not present

## 2021-02-19 DIAGNOSIS — M25511 Pain in right shoulder: Secondary | ICD-10-CM | POA: Diagnosis not present

## 2021-02-24 DIAGNOSIS — M25511 Pain in right shoulder: Secondary | ICD-10-CM | POA: Diagnosis not present

## 2021-02-26 DIAGNOSIS — M25511 Pain in right shoulder: Secondary | ICD-10-CM | POA: Diagnosis not present

## 2021-03-03 DIAGNOSIS — Z20822 Contact with and (suspected) exposure to covid-19: Secondary | ICD-10-CM | POA: Diagnosis not present

## 2021-03-19 ENCOUNTER — Other Ambulatory Visit: Payer: Self-pay | Admitting: Internal Medicine

## 2021-03-19 DIAGNOSIS — Z1231 Encounter for screening mammogram for malignant neoplasm of breast: Secondary | ICD-10-CM

## 2021-04-01 DIAGNOSIS — M13841 Other specified arthritis, right hand: Secondary | ICD-10-CM | POA: Diagnosis not present

## 2021-04-01 DIAGNOSIS — R2231 Localized swelling, mass and lump, right upper limb: Secondary | ICD-10-CM | POA: Diagnosis not present

## 2021-04-01 DIAGNOSIS — M79644 Pain in right finger(s): Secondary | ICD-10-CM | POA: Diagnosis not present

## 2021-04-01 DIAGNOSIS — M25521 Pain in right elbow: Secondary | ICD-10-CM | POA: Diagnosis not present

## 2021-04-28 DIAGNOSIS — M67823 Other specified disorders of tendon, right elbow: Secondary | ICD-10-CM | POA: Diagnosis not present

## 2021-04-28 DIAGNOSIS — M13131 Monoarthritis, not elsewhere classified, right wrist: Secondary | ICD-10-CM | POA: Diagnosis not present

## 2021-04-28 DIAGNOSIS — M7021 Olecranon bursitis, right elbow: Secondary | ICD-10-CM | POA: Diagnosis not present

## 2021-04-28 DIAGNOSIS — G8918 Other acute postprocedural pain: Secondary | ICD-10-CM | POA: Diagnosis not present

## 2021-04-28 DIAGNOSIS — M7031 Other bursitis of elbow, right elbow: Secondary | ICD-10-CM | POA: Diagnosis not present

## 2021-04-28 DIAGNOSIS — M1811 Unilateral primary osteoarthritis of first carpometacarpal joint, right hand: Secondary | ICD-10-CM | POA: Diagnosis not present

## 2021-05-12 ENCOUNTER — Other Ambulatory Visit: Payer: Self-pay

## 2021-05-12 ENCOUNTER — Ambulatory Visit
Admission: RE | Admit: 2021-05-12 | Discharge: 2021-05-12 | Disposition: A | Discharge status: Home / Self Care | Payer: BC Managed Care – PPO | Source: Ambulatory Visit | Attending: Internal Medicine | Admitting: Internal Medicine

## 2021-05-12 DIAGNOSIS — Z1231 Encounter for screening mammogram for malignant neoplasm of breast: Secondary | ICD-10-CM | POA: Diagnosis not present

## 2021-05-13 DIAGNOSIS — Z4789 Encounter for other orthopedic aftercare: Secondary | ICD-10-CM | POA: Diagnosis not present

## 2021-05-13 DIAGNOSIS — M13841 Other specified arthritis, right hand: Secondary | ICD-10-CM | POA: Diagnosis not present

## 2021-05-13 DIAGNOSIS — M7021 Olecranon bursitis, right elbow: Secondary | ICD-10-CM | POA: Diagnosis not present

## 2021-05-15 DIAGNOSIS — Z4789 Encounter for other orthopedic aftercare: Secondary | ICD-10-CM | POA: Diagnosis not present

## 2021-05-22 DIAGNOSIS — I10 Essential (primary) hypertension: Secondary | ICD-10-CM | POA: Diagnosis not present

## 2021-05-22 DIAGNOSIS — E119 Type 2 diabetes mellitus without complications: Secondary | ICD-10-CM | POA: Diagnosis not present

## 2021-05-25 DIAGNOSIS — M79641 Pain in right hand: Secondary | ICD-10-CM | POA: Diagnosis not present

## 2021-05-25 DIAGNOSIS — M1711 Unilateral primary osteoarthritis, right knee: Secondary | ICD-10-CM | POA: Diagnosis not present

## 2021-06-01 DIAGNOSIS — M79641 Pain in right hand: Secondary | ICD-10-CM | POA: Diagnosis not present

## 2021-06-08 DIAGNOSIS — M79641 Pain in right hand: Secondary | ICD-10-CM | POA: Diagnosis not present

## 2021-06-15 DIAGNOSIS — M79641 Pain in right hand: Secondary | ICD-10-CM | POA: Diagnosis not present

## 2021-06-22 DIAGNOSIS — Z4789 Encounter for other orthopedic aftercare: Secondary | ICD-10-CM | POA: Diagnosis not present

## 2021-06-22 DIAGNOSIS — M79641 Pain in right hand: Secondary | ICD-10-CM | POA: Diagnosis not present

## 2021-06-29 DIAGNOSIS — M79641 Pain in right hand: Secondary | ICD-10-CM | POA: Diagnosis not present

## 2021-07-06 DIAGNOSIS — M79641 Pain in right hand: Secondary | ICD-10-CM | POA: Diagnosis not present

## 2021-07-13 DIAGNOSIS — M79641 Pain in right hand: Secondary | ICD-10-CM | POA: Diagnosis not present

## 2021-07-20 DIAGNOSIS — M79641 Pain in right hand: Secondary | ICD-10-CM | POA: Diagnosis not present

## 2021-07-27 DIAGNOSIS — Z4789 Encounter for other orthopedic aftercare: Secondary | ICD-10-CM | POA: Diagnosis not present

## 2021-09-08 DIAGNOSIS — R058 Other specified cough: Secondary | ICD-10-CM | POA: Diagnosis not present

## 2021-09-08 DIAGNOSIS — J069 Acute upper respiratory infection, unspecified: Secondary | ICD-10-CM | POA: Diagnosis not present

## 2021-09-08 DIAGNOSIS — J029 Acute pharyngitis, unspecified: Secondary | ICD-10-CM | POA: Diagnosis not present

## 2021-09-22 DIAGNOSIS — M859 Disorder of bone density and structure, unspecified: Secondary | ICD-10-CM | POA: Diagnosis not present

## 2021-09-22 DIAGNOSIS — E119 Type 2 diabetes mellitus without complications: Secondary | ICD-10-CM | POA: Diagnosis not present

## 2021-09-29 DIAGNOSIS — Z1212 Encounter for screening for malignant neoplasm of rectum: Secondary | ICD-10-CM | POA: Diagnosis not present

## 2021-09-29 DIAGNOSIS — E119 Type 2 diabetes mellitus without complications: Secondary | ICD-10-CM | POA: Diagnosis not present

## 2021-09-29 DIAGNOSIS — R82998 Other abnormal findings in urine: Secondary | ICD-10-CM | POA: Diagnosis not present

## 2021-09-29 DIAGNOSIS — I10 Essential (primary) hypertension: Secondary | ICD-10-CM | POA: Diagnosis not present

## 2021-09-29 DIAGNOSIS — Z0000 Encounter for general adult medical examination without abnormal findings: Secondary | ICD-10-CM | POA: Diagnosis not present

## 2021-09-29 DIAGNOSIS — Z23 Encounter for immunization: Secondary | ICD-10-CM | POA: Diagnosis not present

## 2021-09-30 ENCOUNTER — Other Ambulatory Visit: Payer: Self-pay | Admitting: Internal Medicine

## 2021-09-30 DIAGNOSIS — R932 Abnormal findings on diagnostic imaging of liver and biliary tract: Secondary | ICD-10-CM

## 2021-10-30 ENCOUNTER — Ambulatory Visit
Admission: RE | Admit: 2021-10-30 | Discharge: 2021-10-30 | Disposition: A | Discharge status: Home / Self Care | Payer: BC Managed Care – PPO | Source: Ambulatory Visit | Attending: Internal Medicine | Admitting: Internal Medicine

## 2021-10-30 ENCOUNTER — Other Ambulatory Visit: Payer: Self-pay

## 2021-10-30 DIAGNOSIS — K760 Fatty (change of) liver, not elsewhere classified: Secondary | ICD-10-CM | POA: Diagnosis not present

## 2021-10-30 DIAGNOSIS — K7689 Other specified diseases of liver: Secondary | ICD-10-CM | POA: Diagnosis not present

## 2021-10-30 DIAGNOSIS — R932 Abnormal findings on diagnostic imaging of liver and biliary tract: Secondary | ICD-10-CM

## 2021-10-30 MED ORDER — GADOXETATE DISODIUM 0.25 MMOL/ML IV SOLN
8.0000 mL | Freq: Once | INTRAVENOUS | Status: AC | PRN
  Administered 2021-10-30: 8 mL via INTRAVENOUS

## 2021-11-11 HISTORY — PX: SHOULDER SURGERY: SHX246

## 2021-11-25 DIAGNOSIS — S60221A Contusion of right hand, initial encounter: Secondary | ICD-10-CM | POA: Diagnosis not present

## 2021-11-25 DIAGNOSIS — M79644 Pain in right finger(s): Secondary | ICD-10-CM | POA: Diagnosis not present

## 2021-11-25 DIAGNOSIS — Z4789 Encounter for other orthopedic aftercare: Secondary | ICD-10-CM | POA: Diagnosis not present

## 2022-01-27 DIAGNOSIS — M8580 Other specified disorders of bone density and structure, unspecified site: Secondary | ICD-10-CM | POA: Diagnosis not present

## 2022-01-27 DIAGNOSIS — R945 Abnormal results of liver function studies: Secondary | ICD-10-CM | POA: Diagnosis not present

## 2022-01-27 DIAGNOSIS — E119 Type 2 diabetes mellitus without complications: Secondary | ICD-10-CM | POA: Diagnosis not present

## 2022-01-27 DIAGNOSIS — I10 Essential (primary) hypertension: Secondary | ICD-10-CM | POA: Diagnosis not present

## 2022-01-27 DIAGNOSIS — Z23 Encounter for immunization: Secondary | ICD-10-CM | POA: Diagnosis not present

## 2022-03-08 DIAGNOSIS — L723 Sebaceous cyst: Secondary | ICD-10-CM | POA: Diagnosis not present

## 2022-03-08 DIAGNOSIS — D2261 Melanocytic nevi of right upper limb, including shoulder: Secondary | ICD-10-CM | POA: Diagnosis not present

## 2022-03-08 DIAGNOSIS — L738 Other specified follicular disorders: Secondary | ICD-10-CM | POA: Diagnosis not present

## 2022-03-08 DIAGNOSIS — L918 Other hypertrophic disorders of the skin: Secondary | ICD-10-CM | POA: Diagnosis not present

## 2022-04-01 ENCOUNTER — Encounter: Payer: Self-pay | Admitting: Internal Medicine

## 2022-04-01 ENCOUNTER — Other Ambulatory Visit: Payer: Self-pay | Admitting: Internal Medicine

## 2022-04-01 DIAGNOSIS — Z1231 Encounter for screening mammogram for malignant neoplasm of breast: Secondary | ICD-10-CM

## 2022-04-12 ENCOUNTER — Encounter: Payer: Self-pay | Admitting: Internal Medicine

## 2022-04-13 HISTORY — PX: HAND SURGERY: SHX662

## 2022-05-11 DIAGNOSIS — H5213 Myopia, bilateral: Secondary | ICD-10-CM | POA: Diagnosis not present

## 2022-05-13 ENCOUNTER — Ambulatory Visit
Admission: RE | Admit: 2022-05-13 | Discharge: 2022-05-13 | Disposition: A | Discharge status: Home / Self Care | Payer: BC Managed Care – PPO | Source: Ambulatory Visit | Attending: Internal Medicine | Admitting: Internal Medicine

## 2022-05-13 DIAGNOSIS — Z1231 Encounter for screening mammogram for malignant neoplasm of breast: Secondary | ICD-10-CM | POA: Diagnosis not present

## 2022-05-13 IMAGING — MR MR ABDOMEN WO/W CM
16 series · 48 of 48 positions shown · IV contrast (EOVIST)
Comparison: Prior MRI examination 05/16/2020 and CT scan 05/08/2020

CLINICAL DATA: Follow-up indeterminate hepatic lesion.

EXAM:
MRI ABDOMEN WITHOUT AND WITH CONTRAST
TECHNIQUE: Multiplanar multisequence MR imaging of the abdomen was performed
both before and after the administration of intravenous contrast.
CONTRAST:  8mL EOVIST GADOXETATE DISODIUM 0.25 MOL/L IV SOLN

[Series 4: T1 · axial · 3.0mm · 1.25mm/px · z∈[-204,+33]mm · 6 of 160 slices shown]
[im 1/160]
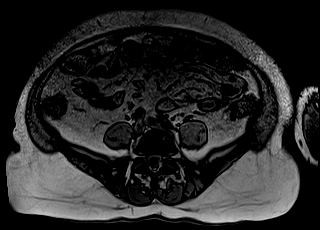
[im 32/160]
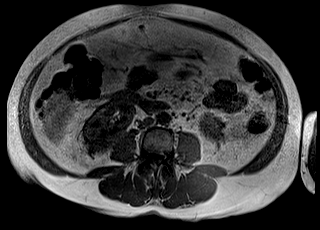
[im 64/160]
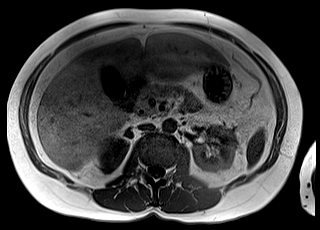
[im 96/160]
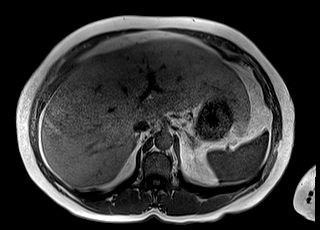
[im 128/160]
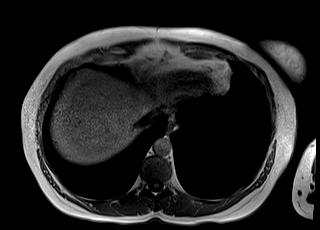
[im 160/160]
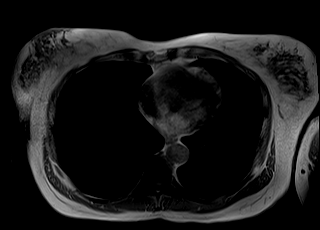

[Series 5: T2 · coronal · 5.0mm · 1.56mm/px · 2 of 40 slices shown (1 of 3)]
[im 1/40]
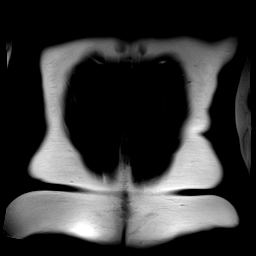
[im 40/40]
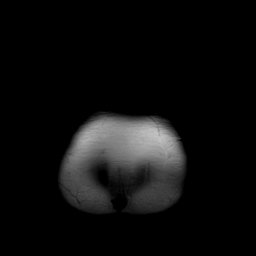

[Series 6: T1 dynamic · axial · non-contrast · 3.0mm · 1.25mm/px · z∈[-204,+33]mm · 3 of 80 slices shown]
[im 1/80]
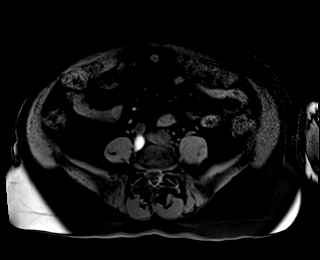
[im 40/80]
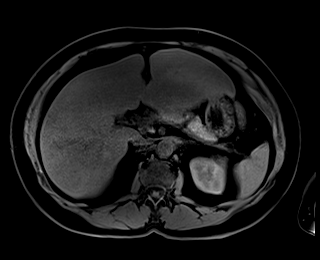
[im 80/80]
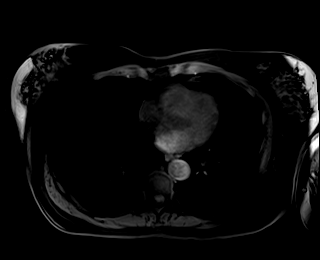

[Series 7: T1 dynamic post-contrast · axial · 3.0mm · 1.25mm/px · z∈[-204,+33]mm · 3 of 80 slices shown (1 of 9)]
[im 1/80]
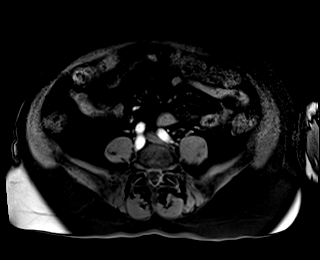
[im 40/80]
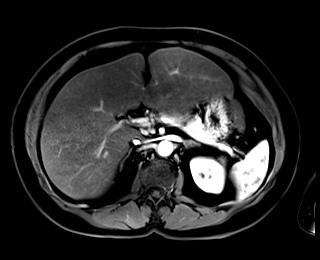
[im 80/80]
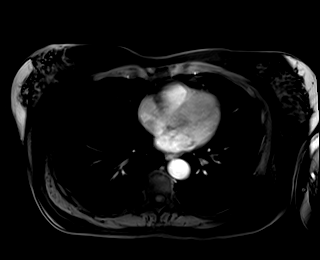

[Series 8: T1 dynamic post-contrast · axial · 3.0mm · 1.25mm/px · z∈[-204,+33]mm · 3 of 80 slices shown (2 of 9)]
[im 1/80]
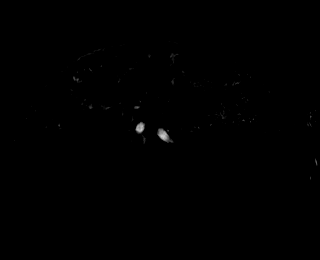
[im 40/80]
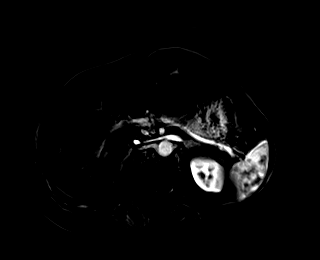
[im 80/80]
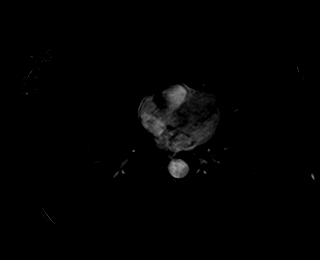

[Series 9: T1 dynamic post-contrast · axial · 3.0mm · 1.25mm/px · z∈[-204,+33]mm · 3 of 80 slices shown (3 of 9)]
[im 1/80]
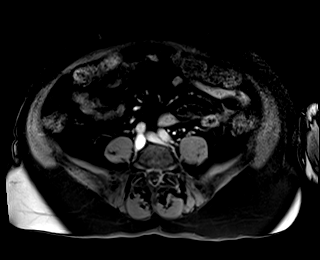
[im 40/80]
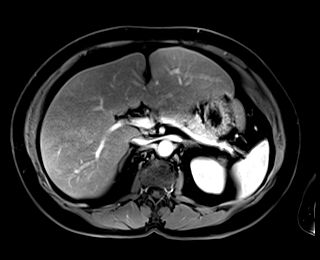
[im 80/80]
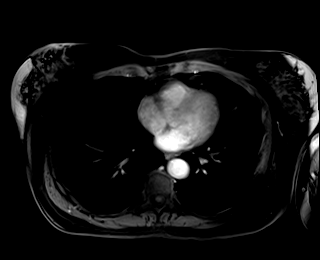

[Series 10: T1 dynamic post-contrast · axial · 3.0mm · 1.25mm/px · z∈[-204,+33]mm · 3 of 80 slices shown (4 of 9)]
[im 1/80]
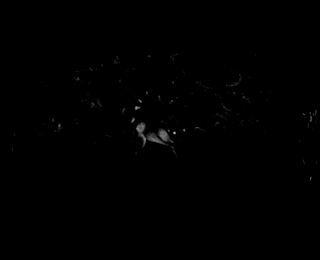
[im 40/80]
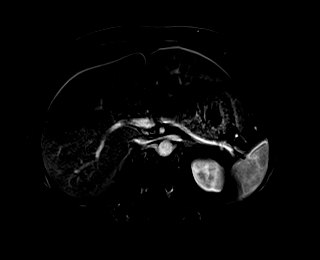
[im 80/80]
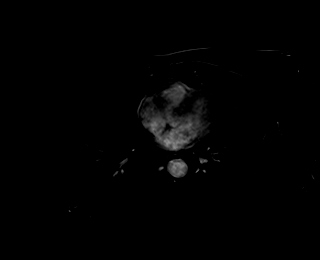

[Series 11: T1 dynamic post-contrast · axial · 3.0mm · 1.25mm/px · z∈[-204,+33]mm · 3 of 80 slices shown (5 of 9)]
[im 1/80]
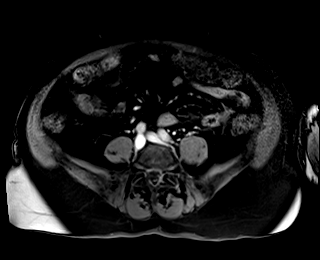
[im 40/80]
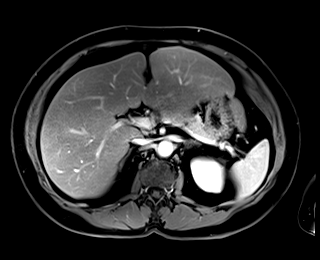
[im 80/80]
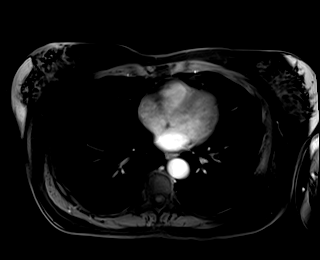

[Series 12: T1 dynamic post-contrast · axial · 3.0mm · 1.25mm/px · z∈[-204,+33]mm · 3 of 80 slices shown (6 of 9)]
[im 1/80]
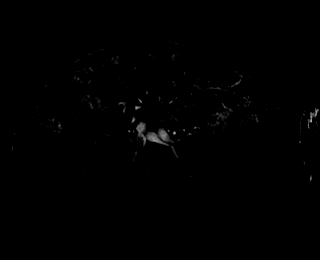
[im 40/80]
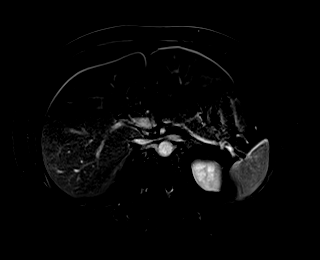
[im 80/80]
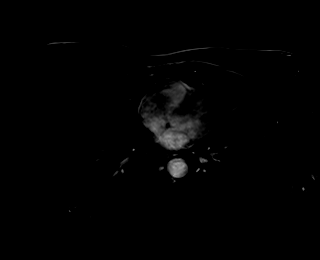

[Series 13: T1 dynamic post-contrast · coronal · 3.0mm · 1.25mm/px · 3 of 80 slices shown (7 of 9)]
[im 1/80]
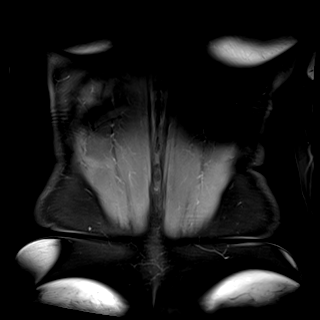
[im 40/80]
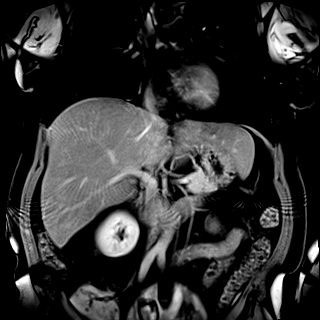
[im 80/80]
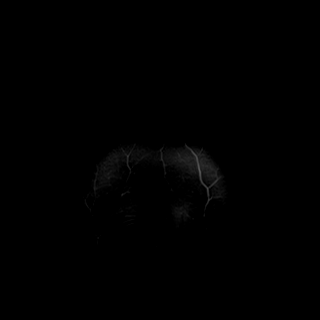

[Series 14: T2 · axial · 5.0mm · 1.56mm/px · z∈[-165,+99]mm · 2 of 45 slices shown (2 of 3)]
[im 1/45]
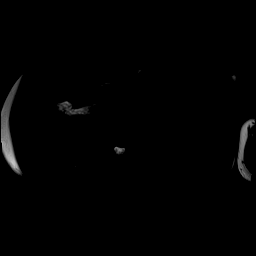
[im 45/45]
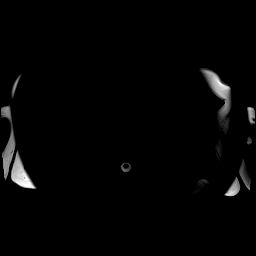

[Series 15: T2 · axial · 6.0mm · 1.25mm/px · 1 of 35 slices shown (3 of 3)]
[im 1/35]
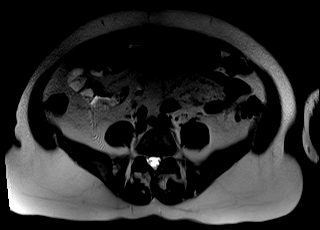

[Series 16: DWI · axial · 5.0mm · 1.42mm/px · z∈[-165,+99]mm · 5 of 135 slices shown (1 of 2)]
[im 1/135]
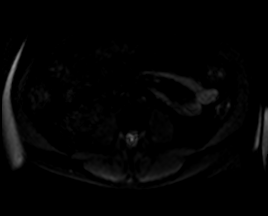
[im 34/135]
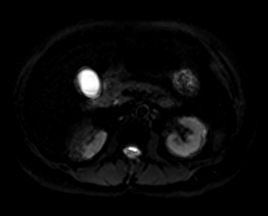
[im 68/135]
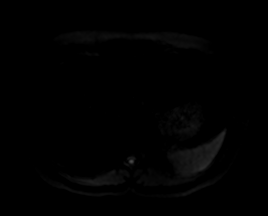
[im 101/135]
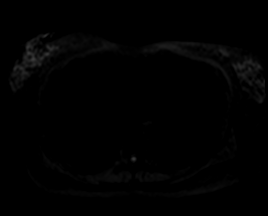
[im 135/135]
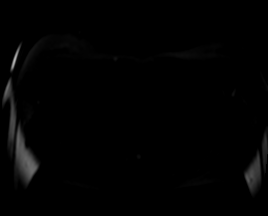

[Series 17: DWI · axial · 5.0mm · 1.42mm/px · z∈[-165,+99]mm · 2 of 45 slices shown (2 of 2)]
[im 1/45]
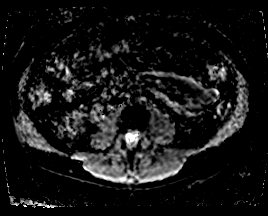
[im 45/45]
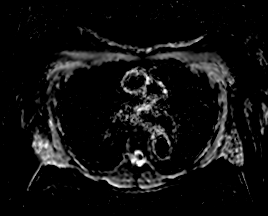

[Series 18: T1 dynamic post-contrast · axial · 3.0mm · 1.25mm/px · z∈[-204,+33]mm · 3 of 80 slices shown (8 of 9)]
[im 1/80]
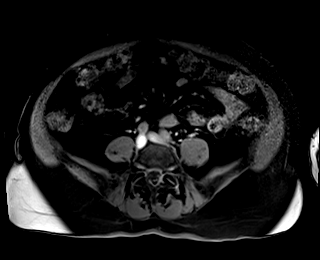
[im 40/80]
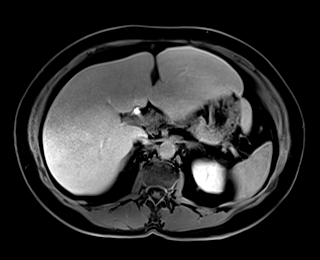
[im 80/80]
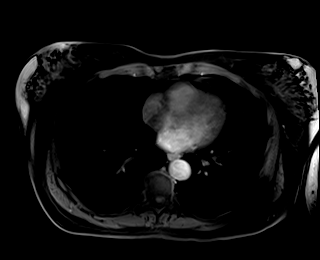

[Series 19: T1 dynamic post-contrast · axial · 3.0mm · 1.25mm/px · z∈[-204,+33]mm · 3 of 80 slices shown (9 of 9)]
[im 1/80]
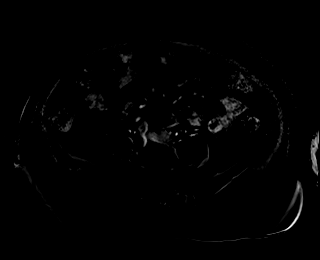
[im 40/80]
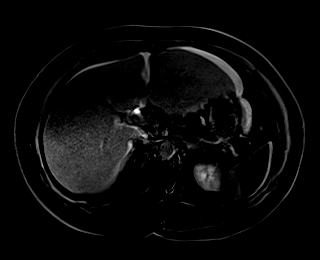
[im 80/80]
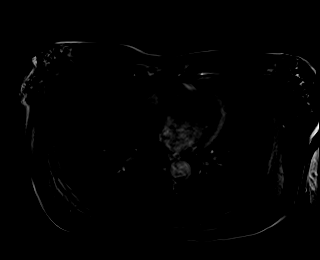

[48 of 48 positions shown; findings below may reference images not displayed]

FINDINGS: Lower chest: The lung bases are clear of an acute process. No
pulmonary lesions or pleural effusions. No pericardial effusion.

Hepatobiliary: Stable diffuse and fairly marked fatty infiltration
of the liver with some areas of focal fatty sparing.

There is a stable lesion noted in segment 6 peripherally. This
demonstrates stable mild increased T2 signal intensity and
subsequent contrast enhancement. Has the appearance of a tangle of
vessels and remains similar to the blood pool on the delayed images.
Although it is possible this is an atypical hemangioma think it is
probably more likely some type of vascular malformation. There is no
mass or mass effect. There is focal fatty sparing associated around
this lesion.

No other hepatic lesions are identified. No intrahepatic biliary
dilatation. The gallbladder is normal. Normal caliber and course of
the common bile duct.

Pancreas:  No mass, inflammation or ductal dilatation.

Spleen:  Normal size. No focal lesions.

Adrenals/Urinary Tract: The adrenal glands and kidneys are
unremarkable and stable.

Stomach/Bowel: The stomach, duodenum, visualized small bowel and
visualized colon are unremarkable.

Vascular/Lymphatic: The aorta and branch vessels are normal. The
major venous structures are normal. No abdominal lymphadenopathy.

Other:  No ascites or abdominal wall hernia.

Musculoskeletal: No significant bony findings.
IMPRESSION: 1. Stable diffuse and fairly marked fatty infiltration of the liver
with some areas of focal fatty sparing.
2. Stable segment 6 liver lesion peripherally. Although it is
possible this is an atypical hemangioma I think it is probably more
likely some type of vascular malformation. Recommend follow-up MR
examination in 1 year.
3. No new or acute abdominal findings.

## 2022-05-14 ENCOUNTER — Encounter: Payer: Self-pay | Admitting: Internal Medicine

## 2022-05-25 ENCOUNTER — Ambulatory Visit (AMBULATORY_SURGERY_CENTER): Payer: Self-pay

## 2022-05-25 VITALS — Ht 65.0 in | Wt 167.0 lb

## 2022-05-25 DIAGNOSIS — Z1211 Encounter for screening for malignant neoplasm of colon: Secondary | ICD-10-CM

## 2022-05-25 MED ORDER — NA SULFATE-K SULFATE-MG SULF 17.5-3.13-1.6 GM/177ML PO SOLN
1.0000 | ORAL | 0 refills | Status: DC
Start: 2022-05-25 — End: 2022-06-22

## 2022-05-25 NOTE — Progress Notes (Signed)
No egg or soy allergy known to patient  No issues known to pt with past sedation with any surgeries or procedures Patient denies ever being told they had issues or difficulty with intubation  No FH of Malignant Hyperthermia Pt is not on diet pills Pt is not on  home 02  Pt is not on blood thinners  Pt denies issues with constipation  No A fib or A flutter Have any cardiac testing pending--denied Pt instructed to use Single[employer] or GoodRx for a price reduction on prep   

## 2022-06-03 DIAGNOSIS — E119 Type 2 diabetes mellitus without complications: Secondary | ICD-10-CM | POA: Diagnosis not present

## 2022-06-03 DIAGNOSIS — I10 Essential (primary) hypertension: Secondary | ICD-10-CM | POA: Diagnosis not present

## 2022-06-09 ENCOUNTER — Encounter: Payer: Self-pay | Admitting: Internal Medicine

## 2022-06-22 ENCOUNTER — Ambulatory Visit (AMBULATORY_SURGERY_CENTER): Payer: BC Managed Care – PPO | Admitting: Internal Medicine

## 2022-06-22 ENCOUNTER — Encounter: Payer: Self-pay | Admitting: Internal Medicine

## 2022-06-22 VITALS — BP 134/74 | HR 64 | Temp 98.6°F | Resp 10 | Ht 65.0 in | Wt 167.0 lb

## 2022-06-22 DIAGNOSIS — D120 Benign neoplasm of cecum: Secondary | ICD-10-CM

## 2022-06-22 DIAGNOSIS — D122 Benign neoplasm of ascending colon: Secondary | ICD-10-CM

## 2022-06-22 DIAGNOSIS — D123 Benign neoplasm of transverse colon: Secondary | ICD-10-CM | POA: Diagnosis not present

## 2022-06-22 DIAGNOSIS — Z1211 Encounter for screening for malignant neoplasm of colon: Secondary | ICD-10-CM | POA: Diagnosis not present

## 2022-06-22 DIAGNOSIS — K635 Polyp of colon: Secondary | ICD-10-CM | POA: Diagnosis not present

## 2022-06-22 MED ORDER — SODIUM CHLORIDE 0.9 % IV SOLN: 500.0000 mL | Freq: Once | INTRAVENOUS | Status: DC

## 2022-06-22 NOTE — Patient Instructions (Signed)
Impression/Recommendations:  Polyp and diverticulosis handouts given to patient.  Resume previous diet. Continue present medications. Await pathology results.  Repeat colonoscopy in 5 or 7 years.  Date to be determined after pathology results reviewed.  YOU HAD AN ENDOSCOPIC PROCEDURE TODAY AT [facility]:   Refer to the procedure report that was given to you for any specific questions about what was found during the examination.  If the procedure report does not answer your questions, please call your gastroenterologist to clarify.  If you requested that your care partner not be given the details of your procedure findings, then the procedure report has been included in a sealed envelope for you to review at your convenience later.  YOU SHOULD EXPECT: Some feelings of bloating in the abdomen. Passage of more gas than usual.  Walking can help get rid of the air that was put into your GI tract during the procedure and reduce the bloating. If you had a lower endoscopy (such as a colonoscopy or flexible sigmoidoscopy) you may notice spotting of blood in your stool or on the toilet paper. If you underwent a bowel prep for your procedure, you may not have a normal bowel movement for a few days.  Please Note:  You might notice some irritation and congestion in your nose or some drainage.  This is from the oxygen used during your procedure.  There is no need for concern and it should clear up in a day or so.  SYMPTOMS TO REPORT IMMEDIATELY:  Following lower endoscopy (colonoscopy or flexible sigmoidoscopy):  Excessive amounts of blood in the stool  Significant tenderness or worsening of abdominal pains  Swelling of the abdomen that is new, acute  Fever of 100F or higher oenterologist can be reached at any hour by calling [phone]. Do not use MyChart messaging for urgent concerns.    DIET:  We do recommend a small meal at first, but then you may proceed to your regular  diet.  Drink plenty of fluids but you should avoid alcoholic beverages for 24 hours.  ACTIVITY:  You should plan to take it easy for the rest of today and you should NOT DRIVE or use heavy machinery until tomorrow (because of the sedation medicines used during the test).    FOLLOW UP: Our staff will call the number listed on your records the next business day following your procedure.  We will call around 7:15- 8:00 am to check on you and address any questions or concerns that you may have regarding the information given to you following your procedure. If we do not reach you, we will leave a message.     If any biopsies were taken you will be contacted by phone or by letter within the next 1-3 weeks.  Please call us at [phone] if you have not heard about the biopsies in 3 weeks.    SIGNATURES/CONFIDENTIALITY: You and/or your care partner have signed paperwork which will be entered into your electronic medical record.  These signatures attest to the fact that that the information above on your After Visit Summary has been reviewed and is understood.  Full responsibility of the confidentiality of this discharge information lies with you and/or your care-partner.

## 2022-06-22 NOTE — Progress Notes (Signed)
PT taken to PACU. Monitors in place. VSS. Report given to RN.   During the procedure, the patient vomited a moderate amount of clear colored fluid. Oral suctioning was promptly performed. No dental or soft tissue damage noted. Risk of aspiration discussed with patient in recovery. She has been instructed to seek medical care if any issues should arise.

## 2022-06-22 NOTE — Op Note (Signed)
[facility] Patient Name: Melinda Wheeler Procedure Date: 06/22/2022 8:46 AM MRN: [id number] Endoscopist: Docia Chuck. Henrene Pastor , MD Age: 61 Referring MD:  Date of Birth: Nov 10, 1960 Gender: Female Account #: [id number] Procedure:                Colonoscopy with cold snare polypectomy x 3 Indications:              Screening for colorectal malignant neoplasm Medicines:                Monitored Anesthesia Care Procedure:                Pre-Anesthesia Assessment:                           - Prior to the procedure, a History and Physical                            was performed, and patient medications and                            allergies were reviewed. The patient's tolerance of                            previous anesthesia was also reviewed. The risks                            and benefits of the procedure and the sedation                            options and risks were discussed with the patient.                            All questions were answered, and informed consent                            was obtained. Prior Anticoagulants: The patient has                            taken no previous anticoagulant or antiplatelet                            agents. ASA Grade Assessment: II - A patient with                            mild systemic disease. After reviewing the risks                            and benefits, the patient was deemed in                            satisfactory condition to undergo the procedure.                           After obtaining informed consent, the colonoscope  was passed under direct vision. Throughout the                            procedure, the patient's blood pressure, pulse, and                            oxygen saturations were monitored continuously. The                            CF [id number] #[id number] was introduced through the anus                            and advanced to the the cecum, identified by                             appendiceal orifice and ileocecal valve. The                            ileocecal valve, appendiceal orifice, and rectum                            were photographed. The quality of the bowel                            preparation was excellent. The colonoscopy was                            performed without difficulty. The patient tolerated                            the procedure well. The bowel preparation used was                            SUPREP via split dose instruction. Scope In: 8:52:48 AM Scope Out: 9:12:45 AM Scope Withdrawal Time: 0 hours 12 minutes 21 seconds  Total Procedure Duration: 0 hours 19 minutes 57 seconds  Findings:                 Three polyps were found in the transverse colon,                            ascending colon and cecum. The polyps were 1 to 5                            mm in size. These polyps were removed with a cold                            snare. Resection and retrieval were complete.                           Multiple diverticula were found in the sigmoid                            colon.  The exam was otherwise without abnormality on                            direct and retroflexion views. Complications:            No immediate complications. Estimated blood loss:                            None. Estimated Blood Loss:     Estimated blood loss: none. Impression:               - Three 1 to 5 mm polyps in the transverse colon,                            in the ascending colon and in the cecum, removed                            with a cold snare. Resected and retrieved.                           - Diverticulosis in the sigmoid colon.                           - The examination was otherwise normal on direct                            and retroflexion views. Recommendation:           - Repeat colonoscopy in 5 years if all polyps                            adenomatous, otherwise 7 years for surveillance.                            - Patient has a contact number available for                            emergencies. The signs and symptoms of potential                            delayed complications were discussed with the                            patient. Return to normal activities tomorrow.                            Written discharge instructions were provided to the                            patient.                           - Resume previous diet.                           - Continue present medications.                           -  Await pathology results. Docia Chuck. Henrene Pastor, MD 06/22/2022 9:19:54 AM This report has been signed electronically.

## 2022-06-22 NOTE — Progress Notes (Signed)
VS completed by CW.   Pt's states no medical or surgical changes since previsit or office visit.  

## 2022-06-22 NOTE — Progress Notes (Signed)
HISTORY OF PRESENT ILLNESS:  Melinda Wheeler is a 61 y.o. female who presents today for screening colonoscopy.  Last examination 2013 was normal  REVIEW OF SYSTEMS:  All non-GI ROS negative. Past Medical History:  Diagnosis Date   Allergy    seasonal   Anxiety    takes Effexor for hot flashes   Arthritis    Asthma    seasonal   Clotting disorder ([place])    1 DVT 5 years ago- was on Xarelto for six weeks only   GERD (gastroesophageal reflux disease)    Hypertension    Seizures ([place])    last seizure 2005/non-convulsive    Past Surgical History:  Procedure Laterality Date   ABDOMINAL HYSTERECTOMY  2011   [place]   COLONOSCOPY     HAND SURGERY Right 04/2022   SHOULDER SURGERY Right 11/2021   UPPER GASTROINTESTINAL ENDOSCOPY      Social History [name]  reports that she has never smoked. She has never used smokeless tobacco. She reports current alcohol use of about 5.0 standard drinks of alcohol per week. She reports that she does not use drugs.  family history includes Breast cancer (age of onset: 72) in her maternal grandmother and paternal grandmother; Heart disease in her mother; Stomach cancer in her father.  Allergies  Allergen Reactions   Codeine Rash       PHYSICAL EXAMINATION: Vital signs: BP 138/77   Pulse 74   Temp 98.6 F (37 C) (Temporal)   Ht '5\' 5"'$  (1.651 m)   Wt 167 lb (75.8 kg)   SpO2 97%   BMI 27.79 kg/m  General: Well-developed, well-nourished, no acute distress HEENT: Sclerae are anicteric, conjunctiva pink. Oral mucosa intact Lungs: Clear Heart: Regular Abdomen: soft, nontender, nondistended, no obvious ascites, no peritoneal signs, normal bowel sounds. No organomegaly. Extremities: No edema Psychiatric: alert and oriented x3. Cooperative      ASSESSMENT:  Colon cancer screening   PLAN:  Screening colonoscopy

## 2022-06-22 NOTE — Progress Notes (Signed)
Called to room to assist during endoscopic procedure.  Patient ID and intended procedure confirmed with present staff. Received instructions for my participation in the procedure from the performing physician.  

## 2022-06-23 ENCOUNTER — Telehealth: Payer: Self-pay | Admitting: *Deleted

## 2022-06-23 NOTE — Telephone Encounter (Signed)
Left message on f/u call 

## 2022-06-30 ENCOUNTER — Encounter: Payer: Self-pay | Admitting: Internal Medicine

## 2022-07-02 ENCOUNTER — Encounter: Payer: BC Managed Care – PPO | Admitting: Internal Medicine

## 2022-07-15 DIAGNOSIS — R0602 Shortness of breath: Secondary | ICD-10-CM | POA: Diagnosis not present

## 2022-07-15 DIAGNOSIS — J029 Acute pharyngitis, unspecified: Secondary | ICD-10-CM | POA: Diagnosis not present

## 2022-07-15 DIAGNOSIS — J40 Bronchitis, not specified as acute or chronic: Secondary | ICD-10-CM | POA: Diagnosis not present

## 2022-09-05 ENCOUNTER — Other Ambulatory Visit: Payer: Self-pay

## 2022-09-05 ENCOUNTER — Other Ambulatory Visit: Payer: Self-pay | Admitting: Physician Assistant

## 2022-09-05 ENCOUNTER — Emergency Department (HOSPITAL_COMMUNITY): Payer: BC Managed Care – PPO

## 2022-09-05 ENCOUNTER — Observation Stay (HOSPITAL_COMMUNITY)
Admission: EM | Admit: 2022-09-05 | Discharge: 2022-09-05 | Discharge status: Against Medical Advice | Payer: BC Managed Care – PPO | Attending: Family Medicine | Admitting: Family Medicine

## 2022-09-05 DIAGNOSIS — Z7984 Long term (current) use of oral hypoglycemic drugs: Secondary | ICD-10-CM | POA: Diagnosis not present

## 2022-09-05 DIAGNOSIS — R079 Chest pain, unspecified: Secondary | ICD-10-CM | POA: Diagnosis present

## 2022-09-05 DIAGNOSIS — Z7952 Long term (current) use of systemic steroids: Secondary | ICD-10-CM | POA: Diagnosis not present

## 2022-09-05 DIAGNOSIS — I10 Essential (primary) hypertension: Secondary | ICD-10-CM | POA: Insufficient documentation

## 2022-09-05 DIAGNOSIS — J45909 Unspecified asthma, uncomplicated: Secondary | ICD-10-CM | POA: Insufficient documentation

## 2022-09-05 DIAGNOSIS — K219 Gastro-esophageal reflux disease without esophagitis: Secondary | ICD-10-CM | POA: Insufficient documentation

## 2022-09-05 DIAGNOSIS — R0789 Other chest pain: Secondary | ICD-10-CM

## 2022-09-05 DIAGNOSIS — Z79899 Other long term (current) drug therapy: Secondary | ICD-10-CM | POA: Diagnosis not present

## 2022-09-05 DIAGNOSIS — R072 Precordial pain: Secondary | ICD-10-CM | POA: Diagnosis not present

## 2022-09-05 LAB — CBC
HCT: 40.2 % (ref 36.0–46.0)
Hemoglobin: 13.5 g/dL (ref 12.0–15.0)
MCH: 29.3 pg (ref 26.0–34.0)
MCHC: 33.6 g/dL (ref 30.0–36.0)
MCV: 87.2 fL (ref 80.0–100.0)
Platelets: 283 10*3/uL (ref 150–400)
RBC: 4.61 MIL/uL (ref 3.87–5.11)
RDW: 11.8 % (ref 11.5–15.5)
WBC: 6.6 10*3/uL (ref 4.0–10.5)
nRBC: 0 % (ref 0.0–0.2)

## 2022-09-05 LAB — BASIC METABOLIC PANEL
Anion gap: 13 (ref 5–15)
BUN: 20 mg/dL (ref 8–23)
CO2: 20 mmol/L — ABNORMAL LOW (ref 22–32)
Calcium: 10.7 mg/dL — ABNORMAL HIGH (ref 8.9–10.3)
Chloride: 101 mmol/L (ref 98–111)
Creatinine, Ser: 0.88 mg/dL (ref 0.44–1.00)
GFR, Estimated: >60 mL/min (ref >60)
Glucose, Bld: 139 mg/dL — ABNORMAL HIGH (ref 70–99)
Potassium: 3.8 mmol/L (ref 3.5–5.1)
Sodium: 134 mmol/L — ABNORMAL LOW (ref 135–145)

## 2022-09-05 LAB — TROPONIN I (HIGH SENSITIVITY)
Troponin I (High Sensitivity): 3 ng/L (ref <18)
Troponin I (High Sensitivity): 3 ng/L (ref <18)

## 2022-09-05 MED ORDER — ATORVASTATIN CALCIUM 40 MG PO TABS: 40.0000 mg | ORAL_TABLET | Freq: Every day | ORAL | Status: DC

## 2022-09-05 MED ORDER — ENOXAPARIN SODIUM 40 MG/0.4ML IJ SOSY: 40.0000 mg | PREFILLED_SYRINGE | INTRAMUSCULAR | Status: DC

## 2022-09-05 MED ORDER — ONDANSETRON HCL 4 MG/2ML IJ SOLN: 4.0000 mg | Freq: Four times a day (QID) | INTRAMUSCULAR | Status: DC | PRN

## 2022-09-05 MED ORDER — ASPIRIN 81 MG PO TBEC: 81.0000 mg | DELAYED_RELEASE_TABLET | Freq: Every day | ORAL | Status: DC

## 2022-09-05 MED ORDER — IRBESARTAN 300 MG PO TABS: 150.0000 mg | ORAL_TABLET | Freq: Every day | ORAL | Status: DC

## 2022-09-05 MED ORDER — PANTOPRAZOLE SODIUM 40 MG PO TBEC: 40.0000 mg | DELAYED_RELEASE_TABLET | Freq: Every day | ORAL | Status: DC

## 2022-09-05 MED ORDER — LEVETIRACETAM 500 MG PO TABS: 500.0000 mg | ORAL_TABLET | Freq: Two times a day (BID) | ORAL | Status: DC

## 2022-09-05 MED ORDER — ACETAMINOPHEN 325 MG PO TABS: 650.0000 mg | ORAL_TABLET | ORAL | Status: DC | PRN

## 2022-09-05 MED ORDER — VENLAFAXINE HCL 25 MG PO TABS: 25.0000 mg | ORAL_TABLET | Freq: Two times a day (BID) | ORAL | Status: DC

## 2022-09-05 NOTE — H&P (Signed)
History and Physical    Melinda Wheeler VCB:[id number] DOB: Sep 07, 1961 DOA: 09/05/2022  PCP: Prince Solian, MD  Patient coming from: Home  I have personally briefly reviewed patient's old medical records in [place]  Chief Complaint: Chest pain  HPI: Melinda Wheeler is a 61 y.o. female with medical history significant of hypertension, type 2 diabetes mellitus, GERD and seizure disorder presented to ED with a complaint of chest pain which started 45 minutes before arrival.  She describes her pain as sharp, located at left anterior chest, radiating to the left shoulder and left jaw, was 10 out of 10, aggravated with deep breathing and no relieving factor.  After some time, her pain subsided to 2 out of 10 on her own without any medications but when she had that severe pain, she also had nausea, diaphoresis, shortness of breath and she felt like she was going to pass out.  She has history of GERD and she tells me that this pain is not like GERD at all.  She carries a strong positive family history in her mother and all her aunt with MI at her age.  ED Course: Upon arrival to ED, blood pressure was slight elevated but otherwise she was hemodynamically stable.  Cardiac exam negative, EKG unremarkable.  ED physician discussed the case with cardiologist who recommended overnight observation for rule out ACS and thus hospitalist were consulted for admission.  Review of Systems: As per HPI otherwise negative.    Past Medical History:  Diagnosis Date   Allergy    seasonal   Anxiety    takes Effexor for hot flashes   Arthritis    Asthma    seasonal   Clotting disorder ([place])    1 DVT 5 years ago- was on Xarelto for six weeks only   GERD (gastroesophageal reflux disease)    Hypertension    Seizures ([place])    last seizure 2005/non-convulsive    Past Surgical History:  Procedure Laterality Date   ABDOMINAL HYSTERECTOMY  2011   [place]   COLONOSCOPY     HAND SURGERY Right  04/2022   SHOULDER SURGERY Right 11/2021   UPPER GASTROINTESTINAL ENDOSCOPY       reports that she has never smoked. She has never used smokeless tobacco. She reports current alcohol use of about 5.0 standard drinks of alcohol per week. She reports that she does not use drugs.  Allergies  Allergen Reactions   Codeine Rash    Family History  Problem Relation Age of Onset   Heart disease Mother    Stomach cancer Father    Breast cancer Maternal Grandmother 22   Breast cancer Paternal Grandmother 64   Colon cancer Neg Hx    Esophageal cancer Neg Hx    Rectal cancer Neg Hx    Colon polyps Neg Hx     Prior to Admission medications   Medication Sig Start Date End Date Taking? Authorizing Provider  albuterol (PROVENTIL HFA;VENTOLIN HFA) 108 (90 BASE) MCG/ACT inhaler Inhale 2 puffs into the lungs every 6 (six) hours as needed. Wheezing/sob    [provider]  fluticasone (FLONASE) 50 MCG/ACT nasal spray SPRAY 2 SPRAYS IN EACH NOSTRIL AS DIRECTED    [provider]  levETIRAcetam (KEPPRA) 500 MG tablet Take 1 tablet by mouth Twice daily. 12/13/11   [provider]  levETIRAcetam (KEPPRA) 500 MG tablet Take 1 tablet by mouth 2 (two) times daily.    [provider]  metFORMIN (GLUCOPHAGE) 500 MG tablet Take 500 mg by mouth 2 (two) times daily. 04/20/22   [provider]  METRONIDAZOLE, TOPICAL, 0.75 % LOTN Apply 1 Application topically 2 (two) times daily. 04/29/22   [provider]  minocycline (DYNACIN) 100 MG tablet Take 1 tablet by mouth daily.    [provider]  olmesartan (BENICAR) 20 MG tablet  10/05/16   [provider]  olmesartan (BENICAR) 20 MG tablet Take 1 tablet by mouth daily.    [provider]  pantoprazole (PROTONIX) 40 MG tablet  08/02/16   [provider]  promethazine (PHENERGAN) 25 MG tablet Take 1 tablet by mouth daily as needed. Patient not taking: Reported on 05/25/2022    [provider]  venlafaxine (EFFEXOR) 25 MG tablet Take 25 mg by mouth 2 (two) times daily.    [provider]    Physical Exam: Vitals:   09/05/22 1300 09/05/22 1308 09/05/22 1533  BP: (!) 168/88  (!) 153/87  Pulse: 82  86  Resp: 18  14  Temp: 97.8 F (36.6 C)    TempSrc: Oral    SpO2: 100%  96%  Weight:  79.4 kg   Height:  '5\' 5"'$  (1.651 m)     Constitutional: NAD, calm, comfortable Vitals:   09/05/22 1300 09/05/22 1308 09/05/22 1533  BP: (!) 168/88  (!) 153/87  Pulse: 82  86  Resp: 18  14  Temp: 97.8 F (36.6 C)    TempSrc: Oral    SpO2: 100%  96%  Weight:  79.4 kg   Height:  '5\' 5"'$  (1.651 m)    Eyes: PERRL, lids and conjunctivae normal ENMT: Mucous membranes are moist. Posterior pharynx clear of any exudate or lesions.Normal dentition.  Neck: normal, supple, no masses, no thyromegaly Respiratory: clear to auscultation bilaterally, no wheezing, no crackles. Normal respiratory effort. No accessory muscle use.  Cardiovascular: Regular rate and rhythm, no murmurs / rubs / gallops. No extremity edema. 2+ pedal pulses. No carotid bruits.  Abdomen: no tenderness, no masses palpated. No hepatosplenomegaly. Bowel sounds positive.  Musculoskeletal: no clubbing / cyanosis. No joint deformity upper and lower extremities. Good ROM, no contractures. Normal muscle tone.  Skin: no rashes, lesions, ulcers. No induration Neurologic: CN 2-12 grossly intact. Sensation intact, DTR normal. Strength 5/5 in all 4.  Psychiatric: Normal judgment and insight. Alert and oriented x 3. Normal mood.    Labs on Admission: I have personally reviewed following labs and imaging studies  CBC: Recent Labs  Lab 09/05/22 1311  WBC 6.6  HGB 13.5  HCT 40.2  MCV 87.2  PLT 115   Basic Metabolic Panel: Recent Labs  Lab 09/05/22 1311  NA 134*  K 3.8  CL 101  CO2 20*  GLUCOSE 139*  BUN 20  CREATININE 0.88  CALCIUM 10.7*   GFR: Estimated Creatinine Clearance: 69.9 mL/min (by C-G  formula based on SCr of 0.88 mg/dL). Liver Function Tests: No results for input(s): "AST", "ALT", "ALKPHOS", "BILITOT", "PROT", "ALBUMIN" in the last 168 hours. No results for input(s): "LIPASE", "AMYLASE" in the last 168 hours. No results for input(s): "AMMONIA" in the last 168 hours. Coagulation Profile: No results for input(s): "INR", "PROTIME" in the last 168 hours. Cardiac Enzymes: No results for input(s): "CKTOTAL", "CKMB", "CKMBINDEX", "TROPONINI" in the last 168 hours. BNP (last 3 results) No results for input(s): "PROBNP" in the last 8760 hours. HbA1C: No results for input(s): "HGBA1C" in the last 72 hours. CBG: No results for input(s): "  GLUCAP" in the last 168 hours. Lipid Profile: No results for input(s): "CHOL", "HDL", "LDLCALC", "TRIG", "CHOLHDL", "LDLDIRECT" in the last 72 hours. Thyroid Function Tests: No results for input(s): "TSH", "T4TOTAL", "FREET4", "T3FREE", "THYROIDAB" in the last 72 hours. Anemia Panel: No results for input(s): "VITAMINB12", "FOLATE", "FERRITIN", "TIBC", "IRON", "RETICCTPCT" in the last 72 hours. Urine analysis: No results found for: "COLORURINE", "APPEARANCEUR", "LABSPEC", "PHURINE", "GLUCOSEU", "HGBUR", "BILIRUBINUR", "KETONESUR", "PROTEINUR", "UROBILINOGEN", "NITRITE", "LEUKOCYTESUR"  Radiological Exams on Admission: DG Chest 2 View  Result Date: 09/05/2022 CLINICAL DATA:  CP EXAM: CHEST - 2 VIEW COMPARISON:  09/30/2017 FINDINGS: Cardiac silhouette is unremarkable. No pneumothorax or pleural effusion. The lungs are clear. The visualized skeletal structures are unremarkable. IMPRESSION: No acute cardiopulmonary process. Electronically Signed   By: Sammie Bench M.D.   On: 09/05/2022 13:54    EKG: Independently reviewed.  Normal sinus rhythm with no acute ST-T wave changes.  Assessment/Plan Principal Problem:   Chest pain   Chest pain: Seems to be typical but subsided on its own and first set of troponin is negative, we will admit for  observation per cardiology recommendation, follow serial cardiac enzymes, monitor on telemetry and order transthoracic echo.  Will check lipid panel.  I will also start her on atorvastatin for now.  Essential hypertension: Slightly elevated.  Resume home medications.  History of seizure disorder: Patient takes Keppra at home and she tells me that she has not had any seizure since about 20 years.  GERD: Resume PPI.  History of type 2 diabetes mellitus: She tells me that she has history of diabetes mellitus but she is not on any medications.  Wondering if she is on diet control.  Will check hemoglobin A1c.  DVT prophylaxis: enoxaparin (LOVENOX) injection 40 mg Start: 09/05/22 1700 Code Status: Full code Family Communication: None present at bedside.  Plan of care discussed with patient in length and he verbalized understanding and agreed with it. Disposition Plan: Home tomorrow hopefully. Consults called: EDP discussed with cardiology.  Darliss Cheney MD [facility]  *Please note that this is a verbal dictation therefore any spelling or grammatical errors are due to the "[employer]" system interpretation.  Please page via [place] and do not message via secure chat for urgent patient care matters. Secure chat can be used for non urgent patient care matters. 09/05/2022, 4:57 PM  To contact the attending provider between 7A-7P or the covering provider during after hours 7P-7A, please log into the web site [email or web address]

## 2022-09-05 NOTE — ED Notes (Signed)
Inpatient provider at bedside.

## 2022-09-05 NOTE — ED Notes (Signed)
Provider speaking with patient via phone b/c pt requesting to go home instead of being admitted.

## 2022-09-05 NOTE — ED Triage Notes (Addendum)
Pt came in POV d/t CP that has been going on for about 45 minutes. She reports no exertion that it came out of nowhere & the "stabbing" pain radiated into her Left jaw & shoulder blade. A/Ox4, 7/10 constant pain. No individual but does have family cardiac Hx.

## 2022-09-05 NOTE — ED Notes (Signed)
Transfer of care report given to inpatient RN.

## 2022-09-05 NOTE — ED Provider Triage Note (Signed)
Emergency Medicine Provider Triage Evaluation Note  ETHLEEN LORMAND , a 61 y.o. female  was evaluated in triage.  Pt complains of sternal/left sided chest pain onset 45 minutes. Pain radiates to her left jaw and left shoulder pain. Pain is stabbing and notes that it has subsided some currently. No history of similar, however, notes that she has had a chest pain occurrence in the past and had to wear a holter monitor. Family history of mother with MI around her age. History of DM, HTN, HLD and compliant with meds. Denies vomiting.     Review of Systems  Positive:  Negative:   Physical Exam  BP (!) 168/88   Pulse 82   Temp 97.8 F (36.6 C) (Oral)   Resp 18   Ht '5\' 5"'$  (1.651 m)   Wt 79.4 kg   SpO2 100%   BMI 29.12 kg/m  Gen:   Awake, no distress   Resp:  Normal effort  MSK:   Moves extremities without difficulty  Other:  Mild TTP noted to left chest wall. TTP noted to left thoracic musculature.  Medical Decision Making  Medically screening exam initiated at 1:21 PM.  Appropriate orders placed.  Mathea ARIYAN SINNETT was informed that the remainder of the evaluation will be completed by another provider, this initial triage assessment does not replace that evaluation, and the importance of remaining in the ED until their evaluation is complete.     Shivon Hackel A, PA-C 09/05/22 1332

## 2022-09-05 NOTE — ED Notes (Signed)
Pt educated on the importance of staying to receive proper care. Pt states she will be leaving AMA.

## 2022-09-05 NOTE — Consult Note (Signed)
Cardiology Consultation:  Patient ID: Melinda Wheeler MRN: [id number]; DOB: September 10, 1961  Admit date: 09/05/2022 Date of Consult: 09/05/2022  Primary Care Provider: Prince Solian, MD Primary Cardiologist: None  Primary Electrophysiologist:  None   Patient Profile:  Melinda Wheeler is a 61 y.o. female with a hx of DM, HTN, GERD who is being seen today for the evaluation of chest pain at the request of Melinda Cheney, MD.  History of Present Illness:  [name] presents with sudden chest pain that started around noon today.  She reports she was preparing lunch with her husband.  She developed sharp central chest discomfort.  Symptoms lasted roughly 1 hour.  She reports symptoms were not worsened by activity.  Rest did not improve her symptoms.  She reports pain in her jaw.  Pain was described as lasting up to 1 hour but resolved without intervention.  She reports she has had no further chest pain since being in the emergency room.  She reports no fevers or chills.  No associated shortness of breath.  She is resting comfortably.  Her EKG shows normal sinus rhythm with no acute ischemic changes.  High-sensitivity troponins are negative x 2.  She has normal kidney function.  Chest x-ray is negative.  Telemetry shows no arrhythmias.  Medical history is significant for diabetes that is well-controlled on metformin.  She reports she has hypertension but this is controlled as well.  She has never personally had a heart attack or stroke but does report a strong family history in her mother.  She reports no recent stress or anxiety.  She does have acid reflux.  Symptoms have been controlled.  Heart Pathway Score:       Past Medical History: Past Medical History:  Diagnosis Date   Allergy    seasonal   Anxiety    takes Effexor for hot flashes   Arthritis    Asthma    seasonal   Clotting disorder ([place])    1 DVT 5 years ago- was on Xarelto for six weeks only   GERD (gastroesophageal reflux disease)     Hypertension    Seizures ([place])    last seizure 2005/non-convulsive    Past Surgical History: Past Surgical History:  Procedure Laterality Date   ABDOMINAL HYSTERECTOMY  2011   [place]   COLONOSCOPY     HAND SURGERY Right 04/2022   SHOULDER SURGERY Right 11/2021   UPPER GASTROINTESTINAL ENDOSCOPY       Home Medications:  Prior to Admission medications   Medication Sig Start Date End Date Taking? Authorizing Provider  venlafaxine (EFFEXOR) 25 MG tablet Take 25 mg by mouth 2 (two) times daily.   Yes [provider]  albuterol (PROVENTIL HFA;VENTOLIN HFA) 108 (90 BASE) MCG/ACT inhaler Inhale 2 puffs into the lungs every 6 (six) hours as needed for wheezing or shortness of breath. Wheezing/sob    [provider]  fluticasone (FLONASE) 50 MCG/ACT nasal spray Place 2 sprays into both nostrils daily.    [provider]  levETIRAcetam (KEPPRA) 500 MG tablet Take 500 mg by mouth 2 (two) times daily.    [provider]  metFORMIN (GLUCOPHAGE) 500 MG tablet Take 500 mg by mouth 2 (two) times daily. 04/20/22   [provider]  METRONIDAZOLE, TOPICAL, 0.75 % LOTN Apply 1 Application topically 2 (two) times daily. 04/29/22   [provider]  minocycline (DYNACIN) 100 MG tablet Take 100 mg by mouth daily.    [provider]  olmesartan (BENICAR) 20 MG tablet Take 20 mg by mouth daily.    [provider]  pantoprazole (PROTONIX) 40 MG tablet Take 40 mg by mouth daily. 08/02/16   [provider]  promethazine (PHENERGAN) 25 MG tablet Take 1 tablet by mouth daily as needed. Patient not taking: Reported on 05/25/2022    [provider]    Inpatient Medications: Scheduled Meds:  aspirin EC  81 mg Oral Daily   atorvastatin  40 mg Oral Daily   enoxaparin (LOVENOX) injection  40 mg Subcutaneous Q24H   irbesartan  150 mg Oral Daily   levETIRAcetam  500 mg Oral BID   pantoprazole  40 mg Oral Daily    venlafaxine  25 mg Oral BID   Continuous Infusions:  sodium chloride     sodium chloride     PRN Meds: acetaminophen, ondansetron (ZOFRAN) IV  Allergies:    Allergies  Allergen Reactions   Codeine Rash    Social History:   Social History   Socioeconomic History   Marital status: Married    Spouse name: Not on file   Number of children: Not on file   Years of education: Not on file   Highest education level: Not on file  Occupational History   Not on file  Tobacco Use   Smoking status: Never   Smokeless tobacco: Never  Vaping Use   Vaping Use: Never used  Substance and Sexual Activity   Alcohol use: Yes    Alcohol/week: 5.0 standard drinks of alcohol    Types: 5 Glasses of wine per week   Drug use: No   Sexual activity: Not on file  Other Topics Concern   Not on file  Social History Narrative   Not on file   Social Determinants of Health   Financial Resource Strain: Not on file  Food Insecurity: Not on file  Transportation Needs: Not on file  Physical Activity: Not on file  Stress: Not on file  Social Connections: Not on file  Intimate Partner Violence: Not on file     Family History:    Family History  Problem Relation Age of Onset   Heart disease Mother    Stomach cancer Father    Breast cancer Maternal Grandmother 66   Breast cancer Paternal Grandmother 75   Colon cancer Neg Hx    Esophageal cancer Neg Hx    Rectal cancer Neg Hx    Colon polyps Neg Hx      ROS:  All other ROS reviewed and negative. Pertinent positives noted in the HPI.     Physical Exam/Data:   Vitals:   09/05/22 1300 09/05/22 1308 09/05/22 1533  BP: (!) 168/88  (!) 153/87  Pulse: 82  86  Resp: 18  14  Temp: 97.8 F (36.6 C)    TempSrc: Oral    SpO2: 100%  96%  Weight:  79.4 kg   Height:  '5\' 5"'$  (1.651 m)    No intake or output data in the 24 hours ending 09/05/22 1710     09/05/2022    1:08 PM 06/22/2022    7:50 AM 05/25/2022    7:57 AM  Last 3 Weights  Weight  (lbs) 175 lb 167 lb 167 lb  Weight (kg) 79.379 kg 75.751 kg 75.751 kg    Body mass index is 29.12 kg/m.  General: Well nourished, well developed, in no acute distress Head: Atraumatic, normal size  Eyes: PEERLA, EOMI  Neck: Supple, no JVD  Endocrine: No thryomegaly Cardiac: Normal S1, S2; RRR; no murmurs, rubs, or gallops Lungs: Clear to auscultation bilaterally, no wheezing, rhonchi or rales  Abd: Soft, nontender, no hepatomegaly  Ext: No edema, pulses 2+ Musculoskeletal: No deformities, BUE and BLE strength normal and equal Skin: Warm and dry, no rashes   Neuro: Alert and oriented to person, place, time, and situation, CNII-XII grossly intact, no focal deficits  Psych: Normal mood and affect   EKG:  The EKG was personally reviewed and demonstrates: Normal sinus rhythm heart rate 84, no acute ischemic changes Telemetry:  Telemetry was personally reviewed and demonstrates: Normal sinus rhythm heart rate in the 80s  Relevant CV Studies: None  Laboratory Data: High Sensitivity Troponin:   Recent Labs  Lab 09/05/22 1311 09/05/22 1511  TROPONINIHS 3 3     Cardiac EnzymesNo results for input(s): "TROPONINI" in the last 168 hours. No results for input(s): "TROPIPOC" in the last 168 hours.  Chemistry Recent Labs  Lab 09/05/22 1311  NA 134*  K 3.8  CL 101  CO2 20*  GLUCOSE 139*  BUN 20  CREATININE 0.88  CALCIUM 10.7*  GFRNONAA >60  ANIONGAP 13    No results for input(s): "PROT", "ALBUMIN", "AST", "ALT", "ALKPHOS", "BILITOT" in the last 168 hours. Hematology Recent Labs  Lab 09/05/22 1311  WBC 6.6  RBC 4.61  HGB 13.5  HCT 40.2  MCV 87.2  MCH 29.3  MCHC 33.6  RDW 11.8  PLT 283   BNPNo results for input(s): "BNP", "PROBNP" in the last 168 hours.  DDimer No results for input(s): "DDIMER" in the last 168 hours.  Radiology/Studies:  DG Chest 2 View  Result Date: 09/05/2022 CLINICAL DATA:  CP EXAM: CHEST - 2 VIEW COMPARISON:  09/30/2017 FINDINGS: Cardiac  silhouette is unremarkable. No pneumothorax or pleural effusion. The lungs are clear. The visualized skeletal structures are unremarkable. IMPRESSION: No acute cardiopulmonary process. Electronically Signed   By: Sammie Bench M.D.   On: 09/05/2022 13:54    Assessment and Plan:   # Acute chest pain, ACS ruled out -She presents with sharp chest discomfort that occurred at rest.  Symptoms have resolved.  Not exertional.  Not alleviated by rest.  EKG is nonischemic.  High-sensitivity troponins are negative x 2.  Her exam is normal.  Her telemetry is unremarkable. -She has had no further chest pain symptoms.  She is low to intermediate risk based on my assessment.  I think she is stable for discharge with outpatient coronary CTA to be arranged by me. -I discussed her case with hospital medicine.  Apparently the emergency room has asked for her to be admitted to observation.  I will defer this recommendation to the hospital medicine team. -Based on my assessment I think she is stable for discharge with close follow-up.  Clearly should she develop recurrent chest pain symptoms she should return to the emergency room for expedited cardiac evaluation. -In the interim I would treat her for acid reflux.  Chest pain symptoms that are noncardiac are commonly related to GERD.  [place] will sign off.   Medication Recommendations: Evaluation and treatment for acid reflux. Other recommendations (labs, testing, etc): Coronary CTA will be arranged as an outpatient. Follow up as an outpatient: We will arrange outpatient coronary CTA by me.  I will then follow-up with her based on the results of this test.  For questions or updates, please contact [place] Please consult [email or web address] for contact info under   Signed, [place]  Doreatha Lew, MD, [place]  09/05/2022 5:10 PM

## 2022-09-05 NOTE — ED Provider Notes (Signed)
[facility] EMERGENCY DEPARTMENT Provider Note   CSN: [id number] Arrival date & time: 09/05/22  1256     History  Chief Complaint  Patient presents with   Chest Pain    Melinda Wheeler is a 61 y.o. female.  61 year old female with past medical history significant for diabetes, hypertension who presents today with left-sided substernal chest pain that began 45 minutes prior to arrival.  She rated this pain as a 10/10.  She stated that it radiated to her left jaw, and left shoulder.  Denies history of CAD.  Denies history of similar episodes of chest pain.  Currently her chest pain has improved to a 2/10 without intervention.  She endorses doing some chores around the house but no other significant exertion.  Endorses history of CAD, and MI for her mother.  No other significant family history of cardiac disease.  Denies history of recent travel, history of DVT, or PE.  The history is provided by the patient. No language interpreter was used.       Home Medications Prior to Admission medications   Medication Sig Start Date End Date Taking? Authorizing Provider  albuterol (PROVENTIL HFA;VENTOLIN HFA) 108 (90 BASE) MCG/ACT inhaler Inhale 2 puffs into the lungs every 6 (six) hours as needed. Wheezing/sob    [provider]  fluticasone (FLONASE) 50 MCG/ACT nasal spray SPRAY 2 SPRAYS IN EACH NOSTRIL AS DIRECTED    [provider]  levETIRAcetam (KEPPRA) 500 MG tablet Take 1 tablet by mouth Twice daily. 12/13/11   [provider]  levETIRAcetam (KEPPRA) 500 MG tablet Take 1 tablet by mouth 2 (two) times daily.    [provider]  metFORMIN (GLUCOPHAGE) 500 MG tablet Take 500 mg by mouth 2 (two) times daily. 04/20/22   [provider]  METRONIDAZOLE, TOPICAL, 0.75 % LOTN Apply 1 Application topically 2 (two) times daily. 04/29/22   [provider]  minocycline (DYNACIN) 100 MG tablet Take 1 tablet by mouth daily.    [provider]  olmesartan (BENICAR) 20 MG tablet  10/05/16   [provider]  olmesartan (BENICAR) 20 MG tablet Take 1 tablet by mouth daily.    [provider]  pantoprazole (PROTONIX) 40 MG tablet  08/02/16   [provider]  promethazine (PHENERGAN) 25 MG tablet Take 1 tablet by mouth daily as needed. Patient not taking: Reported on 05/25/2022    [provider]  venlafaxine (EFFEXOR) 25 MG tablet Take 25 mg by mouth 2 (two) times daily.    [provider]      Allergies    Codeine    Review of Systems   Review of Systems  Constitutional:  Negative for chills, diaphoresis and fever.  Respiratory:  Negative for shortness of breath.   Cardiovascular:  Positive for chest pain. Negative for palpitations and leg swelling.  Gastrointestinal:  Negative for abdominal pain, nausea and vomiting.  Neurological:  Negative for syncope and light-headedness.  All other systems reviewed and are negative.   Physical Exam Updated Vital Signs BP (!) 153/87   Pulse 86   Temp 97.8 F (36.6 C) (Oral)   Resp 14   Ht '5\' 5"'$  (1.651 m)   Wt 79.4 kg   SpO2 96%   BMI 29.12 kg/m  Physical Exam Vitals and nursing note reviewed.  Constitutional:      General: She is not in acute distress.    Appearance: Normal appearance. She is not ill-appearing.  HENT:  Head: Normocephalic and atraumatic.     Nose: Nose normal.  Eyes:     General: No scleral icterus.    Extraocular Movements: Extraocular movements intact.     Conjunctiva/sclera: Conjunctivae normal.  Cardiovascular:     Rate and Rhythm: Normal rate and regular rhythm.     Pulses: Normal pulses.  Pulmonary:     Effort: Pulmonary effort is normal. No respiratory distress.     Breath sounds: Normal breath sounds. No wheezing or rales.  Abdominal:     General: There is no distension.     Tenderness: There is no abdominal tenderness.  Musculoskeletal:        General: Normal range of motion.      Cervical back: Normal range of motion.     Right lower leg: No edema.     Left lower leg: No edema.  Skin:    General: Skin is warm and dry.  Neurological:     General: No focal deficit present.     Mental Status: She is alert. Mental status is at baseline.     ED Results / Procedures / Treatments   Labs (all labs ordered are listed, but only abnormal results are displayed) Labs Reviewed  BASIC METABOLIC PANEL - Abnormal; Notable for the following components:      Result Value   Sodium 134 (*)    CO2 20 (*)    Glucose, Bld 139 (*)    Calcium 10.7 (*)    All other components within normal limits  CBC  TROPONIN I (HIGH SENSITIVITY)  TROPONIN I (HIGH SENSITIVITY)    EKG None  Radiology DG Chest 2 View  Result Date: 09/05/2022 CLINICAL DATA:  CP EXAM: CHEST - 2 VIEW COMPARISON:  09/30/2017 FINDINGS: Cardiac silhouette is unremarkable. No pneumothorax or pleural effusion. The lungs are clear. The visualized skeletal structures are unremarkable. IMPRESSION: No acute cardiopulmonary process. Electronically Signed   By: Sammie Bench M.D.   On: 09/05/2022 13:54    Procedures Procedures    Medications Ordered in ED Medications - No data to display  ED Course/ Medical Decision Making/ A&P                           Medical Decision Making Amount and/or Complexity of Data Reviewed Labs: ordered. Radiology: ordered.   Medical Decision Making / ED Course   This patient presents to the ED for concern of chest pain, this involves an extensive number of treatment options, and is a complaint that carries with it a high risk of complications and morbidity.  The differential diagnosis includes ACS, PE, pneumonia, MSK pain, dissection  MDM: 61 year old female with past medical history as noted above presents today for evaluation of left-sided chest pain that radiated to her left jaw, left shoulder and was rated as a 10/10.  Improved to a 2/10 currently.  Denies dyspnea.  Has  family history of CAD and personal risk factors including hypertension, diabetes.  Started 45 minutes prior to arrival.  Heart score of 5 given history of chest pain, personal risk factors, and age.  EKG without acute ischemic changes.  Initial troponin of 3.  She is not tachycardic, tachypneic, or hypoxic.  Low suspicion for PE.  Chest x-ray without acute cardiopulmonary process.  Given her suggestive history and elevated heart score Case discussed with cardiology who recommend overnight obs.  Will discuss with hospitalist for admission.   Lab Tests: -I ordered, reviewed, and interpreted  labs.   The pertinent results include:   Labs Reviewed  BASIC METABOLIC PANEL - Abnormal; Notable for the following components:      Result Value   Sodium 134 (*)    CO2 20 (*)    Glucose, Bld 139 (*)    Calcium 10.7 (*)    All other components within normal limits  CBC  TROPONIN I (HIGH SENSITIVITY)  TROPONIN I (HIGH SENSITIVITY)      EKG  EKG Interpretation  Date/Time:    Ventricular Rate:    PR Interval:    QRS Duration:   QT Interval:    QTC Calculation:   R Axis:     Text Interpretation:           Imaging Studies ordered: I ordered imaging studies including chest x-ray I independently visualized and interpreted imaging. I agree with the radiologist interpretation   Medicines ordered and prescription drug management: No orders of the defined types were placed in this encounter.   -I have reviewed the patients home medicines and have made adjustments as needed   Consultations Obtained: I requested consultation with the cardiology (Dr. Harl Bowie),  and discussed lab and imaging findings as well as pertinent plan - they recommend: Admission for overnight ops and serial troponins and echo   Cardiac Monitoring: The patient was maintained on a cardiac monitor.  I personally viewed and interpreted the cardiac monitored which showed an underlying rhythm of: sinus rhythm    Reevaluation: After the interventions noted above, I reevaluated the patient and found that they have :improved  Co morbidities that complicate the patient evaluation  Past Medical History:  Diagnosis Date   Allergy    seasonal   Anxiety    takes Effexor for hot flashes   Arthritis    Asthma    seasonal   Clotting disorder ([place])    1 DVT 5 years ago- was on Xarelto for six weeks only   GERD (gastroesophageal reflux disease)    Hypertension    Seizures ([place])    last seizure 2005/non-convulsive      Dispostion: Discussed with hospitalist who will evaluate patient for admission.   Final Clinical Impression(s) / ED Diagnoses Final diagnoses:  Substernal chest pain    Rx / DC Orders ED Discharge Orders     None         Evlyn Courier, PA-C 09/05/22 1656    Jeanell Sparrow, DO 09/05/22 1703

## 2022-09-05 NOTE — ED Notes (Signed)
Pt well appearing at this time. Denies SOB and CP.

## 2022-09-06 NOTE — Discharge Summary (Signed)
Physician Discharge Summary  Melinda Wheeler:[id number] DOB: [birth year] DOA: 09/05/2022  PCP: Prince Solian, MD  Admit date: 09/05/2022 Discharge date: 09/06/2022    Admitted From: Home Disposition: Left AGAINST MEDICAL ADVICE  Recommendations for Outpatient Follow-up:  Follow up with PCP in 1-2 weeks Please obtain BMP/CBC in one week Please follow up with your PCP on the following pending results: Unresulted Labs (From admission, onward)    None         Home Health: None Equipment/Devices: None  Discharge Condition: Fair CODE STATUS: Full code Diet recommendation: Cardiac  Subjective: Patient was admitted in the late afternoon on 09/05/2022 for chest pain rule out MI.  Patient was admitted based on the cardiology's recommendations and they recommended observing overnight.  I have discussed those recommendations with the patient and she was agreeable to stay when I last talked to her around 7:15 PM however it is noted that in about 30 minutes or so, she desired to leave and left AGAINST MEDICAL ADVICE.  I was off duty at that time.   Discharge plan was discussed with patient and/or family member and they verbalized understanding and agreed with it.  Discharge Diagnoses:  Principal Problem:   Chest pain Active Problems:   Essential hypertension   GERD (gastroesophageal reflux disease)    Discharge Instructions   Allergies as of 09/05/2022       Reactions   Hydrocodone-acetaminophen Nausea And Vomiting   Tramadol Hcl    Other Reaction(s): itch, felt wired   Codeine Rash        Medication List     ASK your doctor about these medications    albuterol 108 (90 Base) MCG/ACT inhaler Commonly known as: VENTOLIN HFA Inhale 2 puffs into the lungs every 6 (six) hours as needed for wheezing or shortness of breath. Wheezing/sob   fluticasone 50 MCG/ACT nasal spray Commonly known as: FLONASE Place 2 sprays into both nostrils daily as needed for allergies or  rhinitis.   levETIRAcetam 500 MG tablet Commonly known as: KEPPRA Take 500 mg by mouth 2 (two) times daily. Ask about: Which instructions should I use?   metFORMIN 500 MG tablet Commonly known as: GLUCOPHAGE Take 500 mg by mouth 2 (two) times daily.   METRONIDAZOLE (TOPICAL) 0.75 % Lotn Apply 1 Application topically 2 (two) times daily as needed (skin irritation).   olmesartan 20 MG tablet Commonly known as: BENICAR Take 20 mg by mouth daily. Ask about: Which instructions should I use?   pantoprazole 40 MG tablet Commonly known as: PROTONIX Take 40 mg by mouth 2 (two) times daily.   venlafaxine 25 MG tablet Commonly known as: EFFEXOR Take 25 mg by mouth daily.        Follow-up Information     O'Neal, Cassie Freer, MD Follow up.   Specialties: Cardiology, Internal Medicine, Radiology Why: cardiology office will contact you to arrange outpatient coronary CT and follow up afterward. Contact information: [address] [place] [address] [phone]                Allergies  Allergen Reactions   Hydrocodone-Acetaminophen Nausea And Vomiting   Tramadol Hcl     Other Reaction(s): itch, felt wired   Codeine Rash    Consultations: Cardiology   Procedures/Studies: DG Chest 2 View  Result Date: 09/05/2022 CLINICAL DATA:  CP EXAM: CHEST - 2 VIEW COMPARISON:  09/30/2017 FINDINGS: Cardiac silhouette is unremarkable. No pneumothorax or pleural effusion. The lungs are clear. The visualized skeletal structures are  unremarkable. IMPRESSION: No acute cardiopulmonary process. Electronically Signed   By: Sammie Bench M.D.   On: 09/05/2022 13:54     Discharge Exam: Vitals:   09/05/22 1533 09/05/22 1939  BP: (!) 153/87 (!) 180/100  Pulse: 86   Resp: 14 (!) 24  Temp:    SpO2: 96%    Vitals:   09/05/22 1300 09/05/22 1308 09/05/22 1533 09/05/22 1939  BP: (!) 168/88  (!) 153/87 (!) 180/100  Pulse: 82  86   Resp: 18  14 (!) 24  Temp: 97.8 F (36.6 C)      TempSrc: Oral     SpO2: 100%  96%   Weight:  79.4 kg    Height:  '5\' 5"'$  (1.651 m)      General: Pt is alert, awake, not in acute distress Cardiovascular: RRR, S1/S2 +, no rubs, no gallops Respiratory: CTA bilaterally, no wheezing, no rhonchi Abdominal: Soft, NT, ND, bowel sounds + Extremities: no edema, no cyanosis    The results of significant diagnostics from this hospitalization (including imaging, microbiology, ancillary and laboratory) are listed below for reference.     Microbiology: No results found for this or any previous visit (from the past 240 hour(s)).   Labs: BNP (last 3 results) No results for input(s): "BNP" in the last 8760 hours. Basic Metabolic Panel: Recent Labs  Lab 09/05/22 1311  NA 134*  K 3.8  CL 101  CO2 20*  GLUCOSE 139*  BUN 20  CREATININE 0.88  CALCIUM 10.7*   Liver Function Tests: No results for input(s): "AST", "ALT", "ALKPHOS", "BILITOT", "PROT", "ALBUMIN" in the last 168 hours. No results for input(s): "LIPASE", "AMYLASE" in the last 168 hours. No results for input(s): "AMMONIA" in the last 168 hours. CBC: Recent Labs  Lab 09/05/22 1311  WBC 6.6  HGB 13.5  HCT 40.2  MCV 87.2  PLT 283   Cardiac Enzymes: No results for input(s): "CKTOTAL", "CKMB", "CKMBINDEX", "TROPONINI" in the last 168 hours. BNP: Invalid input(s): "POCBNP" CBG: No results for input(s): "GLUCAP" in the last 168 hours. D-Dimer No results for input(s): "DDIMER" in the last 72 hours. Hgb A1c No results for input(s): "HGBA1C" in the last 72 hours. Lipid Profile No results for input(s): "CHOL", "HDL", "LDLCALC", "TRIG", "CHOLHDL", "LDLDIRECT" in the last 72 hours. Thyroid function studies No results for input(s): "TSH", "T4TOTAL", "T3FREE", "THYROIDAB" in the last 72 hours.  Invalid input(s): "FREET3" Anemia work up No results for input(s): "VITAMINB12", "FOLATE", "FERRITIN", "TIBC", "IRON", "RETICCTPCT" in the last 72 hours. Urinalysis No results  found for: "COLORURINE", "APPEARANCEUR", "LABSPEC", "PHURINE", "GLUCOSEU", "HGBUR", "BILIRUBINUR", "KETONESUR", "PROTEINUR", "UROBILINOGEN", "NITRITE", "LEUKOCYTESUR" Sepsis Labs Recent Labs  Lab 09/05/22 1311  WBC 6.6   Microbiology No results found for this or any previous visit (from the past 240 hour(s)).   Time coordinating discharge:10 minutes  SIGNED:   Darliss Cheney, MD  [facility] 09/06/2022, 9:03 AM *Please note that this is a verbal dictation therefore any spelling or grammatical errors are due to the "[place]" system interpretation. If 7PM-7AM, please contact night-coverage [email or web address]

## 2022-09-07 ENCOUNTER — Telehealth (HOSPITAL_COMMUNITY): Payer: Self-pay | Admitting: *Deleted

## 2022-09-07 MED ORDER — METOPROLOL TARTRATE 100 MG PO TABS: ORAL_TABLET | ORAL | 0 refills | Status: AC

## 2022-09-07 NOTE — Telephone Encounter (Signed)
Returning patient call to confirm her Friday morning CCTA. Metoprolol tartrate sent to pharmacy to test.  Gordy Clement RN Navigator Cardiac Imaging [facility] [phone] Office [phone] Cell

## 2022-09-09 ENCOUNTER — Telehealth (HOSPITAL_COMMUNITY): Payer: Self-pay | Admitting: *Deleted

## 2022-09-09 NOTE — Telephone Encounter (Signed)
Reaching out to patient to offer assistance regarding upcoming cardiac imaging study; pt verbalizes understanding of appt date/time, parking situation and where to check in, pre-test NPO status and medications ordered, and verified current allergies; name and call back number provided for further questions should they arise  Lashawn Bromwell RN Navigator Cardiac Imaging [facility] [phone] office [phone] cell  Patient to take 100mg metoprolol tartrate two hours prior to her cardiac CT scan. She is aware to arrive at 7:30am. 

## 2022-09-10 ENCOUNTER — Ambulatory Visit (HOSPITAL_COMMUNITY)
Admission: RE | Admit: 2022-09-10 | Discharge: 2022-09-10 | Disposition: A | Discharge status: Home / Self Care | Payer: BC Managed Care – PPO | Source: Ambulatory Visit | Attending: Physician Assistant | Admitting: Physician Assistant

## 2022-09-10 DIAGNOSIS — R0789 Other chest pain: Secondary | ICD-10-CM | POA: Insufficient documentation

## 2022-09-10 MED ORDER — IOHEXOL 350 MG/ML SOLN
100.0000 mL | Freq: Once | INTRAVENOUS | Status: AC | PRN
  Administered 2022-09-10: 100 mL via INTRAVENOUS

## 2022-09-10 MED ORDER — NITROGLYCERIN 0.4 MG SL SUBL
SUBLINGUAL_TABLET | SUBLINGUAL | Status: AC
  Filled 2022-09-10: qty 2

## 2022-09-10 MED ORDER — NITROGLYCERIN 0.4 MG SL SUBL
0.8000 mg | SUBLINGUAL_TABLET | Freq: Once | SUBLINGUAL | Status: AC
  Administered 2022-09-10: 0.8 mg via SUBLINGUAL

## 2022-09-13 NOTE — Progress Notes (Unsigned)
Cardiology Clinic Note   Patient Name: Melinda Wheeler Date of Encounter: 09/15/2022  Primary Care Provider:  Prince Solian, MD Primary Cardiologist:  Evalina Field, MD  Patient Profile    Melinda Wheeler is a 62 y.o. female with a past medical history of chest pain, hypertension, T2DM, GERD who presents to the clinic today for hospital follow-up.   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  Coronary CT 09/10/2022: Calcium score 0.  Hypertension.  T2DM.  GERD.   Melinda Wheeler was first seen by Dr. Audie Box during hospitalization for chest pain on 09/05/2022. Patient described sudden onset of stabbing chest pain radiating to left jaw and shoulder blade rated at 7/10. Troponin x 2 negative. EKG did not show any ST-T wave changes. She underwent coronary CT as an outpatient which showed  calcium score of 0.   Today, patient is doing well. Patient denies shortness of breath or dyspnea on exertion. Denies lower extremity edema, orthopnea, or PND. No palpitations.  No further episodes of chest pain.  Patient feels chest pain may be related to scoliosis and tightness in neck radiating down to her upper  chest.  She used to get regular massages that seem to help.  She stays active with walking when the weather is warm.  She has been very busy in the last couple of months with the holidays and also painting her sunroom and working on the yard.  She has an upcoming appointment with her PCP for an annual visit.  He manages her diabetes and does all of her blood work.   Home Medications    Current Meds  Medication Sig   albuterol (PROVENTIL HFA;VENTOLIN HFA) 108 (90 BASE) MCG/ACT inhaler Inhale 2 puffs into the lungs every 6 (six) hours as needed for wheezing or shortness of breath. Wheezing/sob   fluticasone (FLONASE) 50 MCG/ACT nasal spray Place 2 sprays into both nostrils daily as needed for allergies or rhinitis.   levETIRAcetam (KEPPRA) 500 MG tablet Take 500 mg by mouth 2 (two) times daily.   metFORMIN (GLUCOPHAGE) 500 MG tablet Take 500 mg by mouth 2 (two) times daily.   metoprolol tartrate (LOPRESSOR) 100 MG tablet Take tablet ('100mg'$ ) TWO hours prior to your cardiac CT scan.   METRONIDAZOLE, TOPICAL, 0.75 % LOTN Apply 1 Application topically 2 (two) times daily as needed (skin irritation).   olmesartan (BENICAR) 20 MG tablet Take 20 mg by mouth daily.   pantoprazole (PROTONIX) 40 MG tablet Take 40  mg by mouth 2 (two) times daily.   venlafaxine (EFFEXOR) 25 MG tablet Take 25 mg by mouth daily.   Current Facility-Administered Medications for the 09/15/22 encounter (Office Visit) with Mayra Reel, NP  Medication   0.9 %  sodium chloride infusion   0.9 %  sodium chloride infusion    Family History    Family History  Problem Relation Age of Onset   Heart disease Mother    Stomach cancer Father    Breast cancer Maternal Grandmother 85   Breast cancer Paternal Grandmother 41   Colon cancer Neg Hx    Esophageal cancer Neg Hx    Rectal cancer Neg Hx    Colon polyps Neg Hx    She indicated that her mother is alive. She indicated that her father is deceased. She indicated that the  status of her maternal grandmother is unknown. She indicated that the status of her paternal grandmother is unknown. She indicated that the status of her neg hx is unknown.   Social History    Social History   Socioeconomic History   Marital status: Married    Spouse name: Not on file   Number of children: Not on file   Years of education: Not on file   Highest education level: Not on file  Occupational History   Not on file  Tobacco Use   Smoking status: Never   Smokeless tobacco: Never  Vaping Use   Vaping Use: Never used  Substance and Sexual Activity   Alcohol use: Yes    Alcohol/week: 5.0 standard drinks of alcohol    Types: 5 Glasses of wine per week   Drug use: No   Sexual activity: Not on file  Other Topics Concern   Not on file  Social History Narrative   Not on file   Social Determinants of Health   Financial Resource Strain: Not on file  Food Insecurity: Not on file  Transportation Needs: Not on file  Physical Activity: Not on file  Stress: Not on file  Social Connections: Not on file  Intimate Partner Violence: Not on file     Review of Systems    General:  No chills, fever, night sweats or weight changes.  Cardiovascular:  No chest pain, dyspnea on exertion, edema, orthopnea, palpitations, paroxysmal nocturnal dyspnea. Dermatological: No rash, lesions/masses Respiratory: No cough, dyspnea Urologic: No hematuria, dysuria Abdominal:   No nausea, vomiting, diarrhea, bright red blood per rectum, melena, or hematemesis Neurologic:  No visual changes, weakness, changes in mental status. All other systems reviewed and are otherwise negative except as noted above.  Physical Exam    VS:  BP 134/78 (BP Location: Left Arm, Patient Position: Sitting, Cuff Size: Normal)   Pulse 79   Ht 5' 5.5" (1.664 m)   Wt 174 lb (78.9 kg)   BMI 28.51 kg/m  , BMI Body mass index is 28.51 kg/m. GEN:  Well nourished, well developed, in no acute distress. HEENT:  Normal. Neck: Supple, no JVD, carotid bruits, or masses. Cardiac: RRR, no murmurs, rubs, or gallops. No clubbing, cyanosis, edema.  Radials/DP/PT 2+ and equal bilaterally.  Respiratory:  Respirations regular and unlabored, clear to auscultation bilaterally. GI: Soft, nontender, nondistended. MS: No deformity or atrophy. Skin: Warm and dry, no rash. Neuro: Strength and sensation are intact. Psych: Normal affect.  Accessory Clinical Findings    Recent Labs: 09/05/2022: BUN 20; Creatinine, Ser 0.88; Hemoglobin 13.5; Platelets 283; Potassium 3.8; Sodium 134   Recent Lipid Panel No  results found for: "CHOL", "TRIG", "HDL", "CHOLHDL", "VLDL", "LDLCALC", "LDLDIRECT"  ECG personally reviewed by me today: NSR with sinus arrhythmia, HR 79.  No significant changes from 09/05/2022.    Assessment & Plan   Chest pain. Coronary CT December 2023 calcium score 0. Patient denies further episodes of chest pain. No shortness of breath or DOE. She is active with walking and working around her house. No further evaluation needed.  Hypertension. BP today 134/78. Patient denies dizziness or headaches. Continue metoprolol and olmesartan.  T2DM. Managed by PCP.  GERD. Managed by PCP. Continue Protonix.      Disposition: Follow-up in one year or sooner as needed.    Melinda Britain. Xitlalli Newhard, DNP, NP-C     09/15/2022, 9:28 AM [facility] [place] [address] Office [phone] Fax [phone]

## 2022-09-15 ENCOUNTER — Ambulatory Visit: Payer: BC Managed Care – PPO | Attending: Nurse Practitioner | Admitting: Student

## 2022-09-15 ENCOUNTER — Encounter: Payer: Self-pay | Admitting: Nurse Practitioner

## 2022-09-15 VITALS — BP 134/78 | HR 79 | Ht 65.5 in | Wt 174.0 lb

## 2022-09-15 DIAGNOSIS — R072 Precordial pain: Secondary | ICD-10-CM | POA: Diagnosis not present

## 2022-09-15 DIAGNOSIS — I10 Essential (primary) hypertension: Secondary | ICD-10-CM

## 2022-09-15 DIAGNOSIS — K219 Gastro-esophageal reflux disease without esophagitis: Secondary | ICD-10-CM

## 2022-09-15 NOTE — Patient Instructions (Signed)
Medication Instructions:  Your physician recommends that you continue on your current medications as directed. Please refer to the Current Medication list given to you today.   *If you need a refill on your cardiac medications before your next appointment, please call your pharmacy*   Lab Work: NONE ordered at this time of appointment   If you have labs (blood work) drawn today and your tests are completely normal, you will receive your results only by: [place] (if you have MyChart) OR A paper copy in the mail If you have any lab test that is abnormal or we need to change your treatment, we will call you to review the results.   Testing/Procedures: NONE ordered at this time of appointment     Follow-Up: At [facility], you and your health needs are our priority.  As part of our continuing mission to provide you with exceptional heart care, we have created designated Provider Care Teams.  These Care Teams include your primary Cardiologist (physician) and Advanced Practice Providers (APPs -  Physician Assistants and Nurse Practitioners) who all work together to provide you with the care you need, when you need it.  We recommend signing up for the patient portal called "MyChart".  Sign up information is provided on this After Visit Summary.  MyChart is used to connect with patients for Virtual Visits (Telemedicine).  Patients are able to view lab/test results, encounter notes, upcoming appointments, etc.  Non-urgent messages can be sent to your provider as well.   To learn more about what you can do with MyChart, go to [email or web address].    Your next appointment:   1 year(s)  The format for your next appointment:   In Person  Dr. Audie Box      Other Instructions   Important Information About Sugar

## 2022-10-05 DIAGNOSIS — K219 Gastro-esophageal reflux disease without esophagitis: Secondary | ICD-10-CM | POA: Diagnosis not present

## 2022-10-05 DIAGNOSIS — M8580 Other specified disorders of bone density and structure, unspecified site: Secondary | ICD-10-CM | POA: Diagnosis not present

## 2022-10-05 DIAGNOSIS — E119 Type 2 diabetes mellitus without complications: Secondary | ICD-10-CM | POA: Diagnosis not present

## 2022-10-12 DIAGNOSIS — E119 Type 2 diabetes mellitus without complications: Secondary | ICD-10-CM | POA: Diagnosis not present

## 2022-10-12 DIAGNOSIS — Z0000 Encounter for general adult medical examination without abnormal findings: Secondary | ICD-10-CM | POA: Diagnosis not present

## 2022-10-12 DIAGNOSIS — I10 Essential (primary) hypertension: Secondary | ICD-10-CM | POA: Diagnosis not present

## 2022-12-02 DIAGNOSIS — N951 Menopausal and female climacteric states: Secondary | ICD-10-CM | POA: Diagnosis not present

## 2022-12-02 DIAGNOSIS — E119 Type 2 diabetes mellitus without complications: Secondary | ICD-10-CM | POA: Diagnosis not present

## 2022-12-02 DIAGNOSIS — I10 Essential (primary) hypertension: Secondary | ICD-10-CM | POA: Diagnosis not present

## 2023-02-02 DIAGNOSIS — I10 Essential (primary) hypertension: Secondary | ICD-10-CM | POA: Diagnosis not present

## 2023-02-02 DIAGNOSIS — E119 Type 2 diabetes mellitus without complications: Secondary | ICD-10-CM | POA: Diagnosis not present

## 2023-04-04 DIAGNOSIS — D485 Neoplasm of uncertain behavior of skin: Secondary | ICD-10-CM | POA: Diagnosis not present

## 2023-04-04 DIAGNOSIS — L820 Inflamed seborrheic keratosis: Secondary | ICD-10-CM | POA: Diagnosis not present

## 2023-04-07 ENCOUNTER — Other Ambulatory Visit: Payer: Self-pay | Admitting: Internal Medicine

## 2023-04-07 DIAGNOSIS — Z1231 Encounter for screening mammogram for malignant neoplasm of breast: Secondary | ICD-10-CM

## 2023-05-23 ENCOUNTER — Ambulatory Visit
Admission: RE | Admit: 2023-05-23 | Discharge: 2023-05-23 | Disposition: A | Discharge status: Home / Self Care | Payer: BC Managed Care – PPO | Source: Ambulatory Visit | Attending: Internal Medicine | Admitting: Internal Medicine

## 2023-05-23 DIAGNOSIS — Z1231 Encounter for screening mammogram for malignant neoplasm of breast: Secondary | ICD-10-CM

## 2023-06-13 DIAGNOSIS — L723 Sebaceous cyst: Secondary | ICD-10-CM | POA: Diagnosis not present

## 2023-06-13 DIAGNOSIS — D225 Melanocytic nevi of trunk: Secondary | ICD-10-CM | POA: Diagnosis not present

## 2023-06-13 DIAGNOSIS — L718 Other rosacea: Secondary | ICD-10-CM | POA: Diagnosis not present

## 2023-06-13 DIAGNOSIS — L918 Other hypertrophic disorders of the skin: Secondary | ICD-10-CM | POA: Diagnosis not present

## 2023-06-13 DIAGNOSIS — D2372 Other benign neoplasm of skin of left lower limb, including hip: Secondary | ICD-10-CM | POA: Diagnosis not present

## 2023-06-29 DIAGNOSIS — E119 Type 2 diabetes mellitus without complications: Secondary | ICD-10-CM | POA: Diagnosis not present

## 2023-06-29 DIAGNOSIS — I10 Essential (primary) hypertension: Secondary | ICD-10-CM | POA: Diagnosis not present

## 2023-07-20 DIAGNOSIS — L723 Sebaceous cyst: Secondary | ICD-10-CM | POA: Diagnosis not present

## 2023-07-20 DIAGNOSIS — L720 Epidermal cyst: Secondary | ICD-10-CM | POA: Diagnosis not present

## 2024-04-18 ENCOUNTER — Other Ambulatory Visit: Payer: Self-pay | Admitting: Internal Medicine

## 2024-04-18 DIAGNOSIS — Z1231 Encounter for screening mammogram for malignant neoplasm of breast: Secondary | ICD-10-CM

## 2024-05-23 ENCOUNTER — Ambulatory Visit
Admission: RE | Admit: 2024-05-23 | Discharge: 2024-05-23 | Disposition: A | Discharge status: Home / Self Care | Source: Ambulatory Visit | Attending: Internal Medicine | Admitting: Internal Medicine

## 2024-05-23 DIAGNOSIS — Z1231 Encounter for screening mammogram for malignant neoplasm of breast: Secondary | ICD-10-CM

## 2024-08-22 ENCOUNTER — Encounter: Payer: Self-pay | Admitting: Internal Medicine

## 2024-08-22 ENCOUNTER — Encounter: Payer: Self-pay | Admitting: Nurse Practitioner

## 2024-08-22 ENCOUNTER — Other Ambulatory Visit (INDEPENDENT_AMBULATORY_CARE_PROVIDER_SITE_OTHER)

## 2024-08-22 ENCOUNTER — Ambulatory Visit: Admitting: Nurse Practitioner

## 2024-08-22 VITALS — BP 132/60 | HR 68 | Ht 65.0 in | Wt 170.1 lb

## 2024-08-22 DIAGNOSIS — R110 Nausea: Secondary | ICD-10-CM | POA: Diagnosis not present

## 2024-08-22 DIAGNOSIS — R7401 Elevation of levels of liver transaminase levels: Secondary | ICD-10-CM | POA: Diagnosis not present

## 2024-08-22 DIAGNOSIS — R1013 Epigastric pain: Secondary | ICD-10-CM | POA: Diagnosis not present

## 2024-08-22 DIAGNOSIS — K219 Gastro-esophageal reflux disease without esophagitis: Secondary | ICD-10-CM | POA: Diagnosis not present

## 2024-08-22 DIAGNOSIS — Z860100 Personal history of colon polyps, unspecified: Secondary | ICD-10-CM | POA: Diagnosis not present

## 2024-08-22 DIAGNOSIS — R7989 Other specified abnormal findings of blood chemistry: Secondary | ICD-10-CM | POA: Diagnosis not present

## 2024-08-22 DIAGNOSIS — K760 Fatty (change of) liver, not elsewhere classified: Secondary | ICD-10-CM

## 2024-08-22 LAB — HEPATIC FUNCTION PANEL
ALT: 60 U/L — ABNORMAL HIGH (ref 0–35)
AST: 48 U/L — ABNORMAL HIGH (ref 0–37)
Albumin: 5 g/dL (ref 3.5–5.2)
Alkaline Phosphatase: 57 U/L (ref 39–117)
Bilirubin, Direct: 0.1 mg/dL (ref 0.0–0.3)
Total Bilirubin: 0.6 mg/dL (ref 0.2–1.2)
Total Protein: 7.5 g/dL (ref 6.0–8.3)

## 2024-08-22 NOTE — Progress Notes (Signed)
 Noted

## 2024-08-22 NOTE — Progress Notes (Signed)
 08/22/2024 ZYRAH WISWELL [id number] 1960-11-06   Chief Complaint: Discuss having a possible upper endoscopy   History of Present Illness: Melinda Wheeler is a 63 year old female with a past medical history of anxiety, arthritis, hypertension, seizure 2005, DVT hepatic steatosis and GERD.  She is known by Dr. Abran.  She presents today as referred by Dr. Jude for further evaluation regarding GERD and epigastric pain.  She was previously taking pantoprazole  40 mg once daily for GERD symptoms which was increased to twice daily without improvement.  She takes a few Tums as needed, usually around 2 or 3 AM when she awakens at night with GERD symptoms and epigastric pain.  She was started on Voquenza by her PCP approximately 6 weeks ago and her heartburn symptoms have significantly improved, but, she continues to have epigastric pain with intermittent nausea and no vomiting.  Epigastric pain sometimes worsens after she eats.  Apples, solid, seeds or nuts worsen her epigastric pain.  She stated having epigastric pain 75% of the time.  She takes Phenergan as needed for nausea.  She also has brief dysphagia, describes food briefly gets hung up in the upper esophagus and sometimes when she drinks water it feels like a water bubble is stuck in her esophagus.  Episodes of dysphagia occur handful times monthly.  EGD 12/16/2017 showed gastric polyps otherwise was unremarkable.  She drinks 4 to 6 ounces of bourbon nightly with her husband and occasionally goes 1 month without drinking any bourbon.  Due to her epigastric pain, she stopped drinking bourbon 1-1/2 weeks ago but continues to have fairly persistent epigastric pain.  Bowel movements are normal.  No bloody or black stools.  Colonoscopy 06/2022 identified 3 polyps removed from the colon, path report showed benign polypoid colonic mucosa with hyperplastic changes and 1 tubular adenomatous polyp.  1 polyp was not retrieved.  She was advised to repeat a colonoscopy in  7 years.  Labs reviewed by PCP 07/09/2024: WBC 6.90.  Hemoglobin 12.5.  Hematocrit 36.8.  Platelets 252.  Sodium 140.  Potassium 4.3.  BUN 16.  Creatinine 1.1.  Total bili 0.5.  Alk phos 48.  AST 61.  ALT 67.  Albumin 4.9.     Latest Ref Rng & Units 09/05/2022    1:11 PM 09/30/2017    6:12 PM 02/12/2010    5:25 AM  CBC  WBC 4.0 - 10.5 K/uL 6.6  8.2  9.4   Hemoglobin 12.0 - 15.0 g/dL 86.4  86.2  87.7   Hematocrit 36.0 - 46.0 % 40.2  40.9  34.6   Platelets 150 - 400 K/uL 283  266  213        Latest Ref Rng & Units 09/05/2022    1:11 PM 09/30/2017    6:12 PM 02/06/2010    2:06 PM  CMP  Glucose 70 - 99 mg/dL 860  861  92   BUN 8 - 23 mg/dL 20  16  9    Creatinine 0.44 - 1.00 mg/dL 9.11  8.92  9.23   Sodium 135 - 145 mmol/L 134  138  137   Potassium 3.5 - 5.1 mmol/L 3.8  4.0  3.9   Chloride 98 - 111 mmol/L 101  105  105   CO2 22 - 32 mmol/L 20  22  26    Calcium  8.9 - 10.3 mg/dL 89.2  9.8  9.9     GI PROCEDURES:  Colonoscopy 06/22/2022: - Three 1 to 5 mm  polyps in the transverse colon, in the ascending colon and in the cecum, removed with a cold snare. Resected and retrieved.  - Diverticulosis in the sigmoid colon.  - The examination was otherwise normal on direct and retroflexion views. - 7 year recall colonoscopy 1. Surgical [P], colon, ascending and cecum, polyp (2), 1 not retrieved - POLYPOID COLONIC MUCOSA WITH MILD HYPERPLASTIC CHANGES - NEGATIVE FOR DYSPLASIA 2. Surgical [P], colon, transverse, polyp (1) - TUBULAR ADENOMA(S) - NEGATIVE FOR HIGH-GRADE DYSPLASIA OR MALIGNANCY  EGD 12/16/2017: 1. Essentially normal EGD  2. Incidental diminutive gastric polyps  3. GERD 4. Noncardiac chest pain.  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 Wheezing/sob     fluticasone (FLONASE) 50 MCG/ACT nasal spray Place 2 sprays into both nostrils daily as needed for allergies or rhinitis.     levETIRAcetam  (KEPPRA ) 500 MG tablet Take 500 mg by mouth 2 (two) times daily.     metFORMIN (GLUCOPHAGE) 500 MG tablet Take 500 mg by mouth 2 (two) times daily.     metoprolol  tartrate (LOPRESSOR ) 100 MG tablet Take tablet (100mg ) TWO hours prior to your cardiac CT scan. 1 tablet 0   METRONIDAZOLE, TOPICAL, 0.75 % LOTN Apply 1 Application topically 2 (two) times daily as needed (skin irritation).     olmesartan (BENICAR) 20 MG tablet Take 20 mg by mouth daily.     rosuvastatin (CRESTOR) 5 MG tablet Take 5 mg by mouth daily.     Vitamin D, Ergocalciferol, (DRISDOL) 1.25 MG (50000 UNIT) CAPS capsule Take 50,000 Units by mouth every 7 (seven) days.     Vonoprazan Fumarate (VOQUEZNA) 20 MG TABS Take by mouth.     Current Facility-Administered Medications on File Prior to Visit  Medication Dose Route Frequency Provider Last Rate Last Admin   0.9 %  sodium chloride  infusion  500 mL Intravenous Once Abran Norleen SAILOR, MD       0.9 %  sodium chloride  infusion  500 mL Intravenous Once Abran Norleen SAILOR, MD       Allergies  Allergen Reactions   Hydrocodone-Acetaminophen  Nausea And Vomiting   Semaglutide Nausea And Vomiting   Tramadol Hcl     Other Reaction(s): itch, felt wired   Venlafaxine      Other Reaction(s): ineffective for hot flashes    Codeine Rash   Current Medications, Allergies, Past Medical History, Past Surgical History, Family History and Social History were reviewed in [employer] electronic medical record.  Review of Systems:   Constitutional: Negative for fever, sweats, chills or weight loss.  Respiratory: Negative for shortness of breath.  Cardiovascular: Negative for chest pain, palpitations and leg swelling.  Gastrointestinal: See HPI.  Musculoskeletal: Negative for back pain or muscle aches.  Neurological: Negative for dizziness, headaches or paresthesias.   Physical Exam: BP 132/60   Pulse 68   Ht 5' 5 (1.651 m)   Wt 170 lb 2 oz (77.2 kg)   BMI 28.31 kg/m   General: 63 year old female in no acute distress. Head: Normocephalic and atraumatic. Eyes: No scleral icterus. Conjunctiva pink . Ears: Normal auditory acuity. Mouth: Dentition intact. No ulcers or lesions.  Lungs: Clear throughout to auscultation. Heart: Regular rate and rhythm, no murmur. Abdomen: Soft, nondistended.  Mild epigastric tenderness without rebound or guarding.  No masses or hepatomegaly. Normal bowel sounds x 4 quadrants.  Rectal: Deferred. Musculoskeletal: Symmetrical with no gross deformities. Extremities: No edema. Neurological: Alert oriented x 4. No focal deficits.  Psychological: Alert and cooperative. Normal mood and affect  Assessment and Recommendations:  63 year old female with GERD, epigastric pain and dysphagia. Symptoms improved after she started Voquenza 6 weeks ago, however, epigastric pain persists. - EGD benefits and risks discussed including risk with sedation, risk of bleeding, perforation and infection  - RUQ sono to evaluate the gallbladder  - GERD diet - Continue Voquenza 20 mg once daily  Nausea without vomiting  - See plan above   Hepatic steatosis. Elevated AST and ALT levels.  Abdominal MRI with and without contrast 10/30/2021 showed a right liver hemangioma versus vascular malformation  and severe hepatic steatosis.  No alcohol consumption for the past 1-1/2 weeks. - Patient encouraged to reduce alcohol intake - Hepatic panel - If LFTs remain elevated will require further hepatology serologies - RUQ sonogram as ordered above - Patient encouraged to reduce carbohydrates in diet, exercise as tolerated and lose weight to reduce the scale developing advanced fatty liver disease.  History of colon polyps.  Colonoscopy 06/2022 dentified 3 polyps removed from the colon, path report showed benign polypoid colonic mucosa with hyperplastic changes and 1 tubular adenomatous polyp.  One polyp was not retrieved.   - Next colonoscopy due 06/2029

## 2024-08-22 NOTE — Patient Instructions (Addendum)
 _______________________________________________________  If your blood pressure at your visit was 140/90 or greater, please contact your primary care physician to follow up on this.  _______________________________________________________  If you are age 63 or older, your body mass index should be between 23-30. Your Body mass index is 28.31 kg/m. If this is out of the aforementioned range listed, please consider follow up with your Primary Care Provider.  If you are age 93 or younger, your body mass index should be between 19-25. Your Body mass index is 28.31 kg/m. If this is out of the aformentioned range listed, please consider follow up with your Primary Care Provider.   ________________________________________________________  The [facility] providers would like to encourage you to use MYCHART to communicate with providers for non-urgent requests or questions.  Due to long hold times on the telephone, sending your provider a message by [facility] may be a faster and more efficient way to get a response.  Please allow 48 business hours for a response.  Please remember that this is for non-urgent requests.  _______________________________________________________  [facility] is using a team-based approach to care.  Your team is made up of your doctor and two to three APPS. Our APPS (Nurse Practitioners and Physician Assistants) work with your physician to ensure care continuity for you. They are fully qualified to address your health concerns and develop a treatment plan. They communicate directly with your gastroenterologist to care for you. Seeing the Advanced Practice Practitioners on your physician's team can help you by facilitating care more promptly, often allowing for earlier appointments, access to diagnostic testing, procedures, and other specialty referrals.   Your provider has requested that you go to the basement level for lab work before leaving today. Press B on the  elevator. The lab is located at the first door on the left as you exit the elevator.  Continue Voquenza 20mg  once daily   Continue to reduce alcohol intake   Reduce carbohydrates in your diet (ie: reduce bread/potato/rice/pasta and sweet intake, exercise as tolerated and lose weight to reduce your risk of developing advance fatty related liver disease   You have been scheduled for an abdominal ultrasound at [facility] (1st floor of hospital) on 08-29-24 at 10am. Please arrive 30 minutes prior to your appointment for registration. Make certain not to have anything to eat or drink midnight prior to your appointment. Should you need to reschedule your appointment, please contact radiology at [phone]. This test typically takes about 30 minutes to perform.  You have been scheduled for an endoscopy. Please follow written instructions given to you at your visit today.  If you use inhalers (even only as needed), please bring them with you on the day of your procedure.  If you take any of the following medications, they will need to be adjusted prior to your procedure:   DO NOT TAKE 7 DAYS PRIOR TO TEST- Trulicity (dulaglutide) Ozempic, Wegovy (semaglutide) Mounjaro, Zepbound (tirzepatide) Bydureon Bcise (exanatide extended release)  DO NOT TAKE 1 DAY PRIOR TO YOUR TEST Rybelsus (semaglutide) Adlyxin (lixisenatide) Victoza (liraglutide) Byetta (exanatide) ___________________________________________________________________________  It was a pleasure to see you today!  Thank you for trusting me with your gastrointestinal care!

## 2024-08-23 ENCOUNTER — Ambulatory Visit: Payer: Self-pay | Admitting: Nurse Practitioner

## 2024-08-23 DIAGNOSIS — N951 Menopausal and female climacteric states: Secondary | ICD-10-CM | POA: Insufficient documentation

## 2024-08-23 DIAGNOSIS — R7989 Other specified abnormal findings of blood chemistry: Secondary | ICD-10-CM

## 2024-08-23 DIAGNOSIS — M1990 Unspecified osteoarthritis, unspecified site: Secondary | ICD-10-CM | POA: Insufficient documentation

## 2024-08-27 ENCOUNTER — Ambulatory Visit: Admitting: Internal Medicine

## 2024-08-27 ENCOUNTER — Encounter: Payer: Self-pay | Admitting: Internal Medicine

## 2024-08-27 VITALS — BP 127/80 | HR 67 | Temp 97.1°F | Resp 13 | Ht 65.0 in | Wt 170.0 lb

## 2024-08-27 DIAGNOSIS — K2950 Unspecified chronic gastritis without bleeding: Secondary | ICD-10-CM

## 2024-08-27 DIAGNOSIS — K222 Esophageal obstruction: Secondary | ICD-10-CM

## 2024-08-27 DIAGNOSIS — R7989 Other specified abnormal findings of blood chemistry: Secondary | ICD-10-CM

## 2024-08-27 DIAGNOSIS — K219 Gastro-esophageal reflux disease without esophagitis: Secondary | ICD-10-CM

## 2024-08-27 DIAGNOSIS — R110 Nausea: Secondary | ICD-10-CM

## 2024-08-27 MED ORDER — SODIUM CHLORIDE 0.9 % IV SOLN: 500.0000 mL | INTRAVENOUS | Status: DC

## 2024-08-27 MED ORDER — DEXTROSE 5 % IV SOLN: INTRAVENOUS | Status: AC

## 2024-08-27 NOTE — Progress Notes (Signed)
 Report to PACU, RN, vss, BBS= Clear.

## 2024-08-27 NOTE — Progress Notes (Signed)
 Expand All Collapse All        08/22/2024 BRIGHTEN BUZZELLI [id number] 05/18/1961     Chief Complaint: Discuss having a possible upper endoscopy    History of Present Illness: Melinda Wheeler is a 63 year old female with a past medical history of anxiety, arthritis, hypertension, seizure 2005, DVT hepatic steatosis and GERD.  She is known by Dr. Abran.  She presents today as referred by Dr. Jude for further evaluation regarding GERD and epigastric pain.  She was previously taking pantoprazole  40 mg once daily for GERD symptoms which was increased to twice daily without improvement.  She takes a few Tums as needed, usually around 2 or 3 AM when she awakens at night with GERD symptoms and epigastric pain.  She was started on Voquenza by her PCP approximately 6 weeks ago and her heartburn symptoms have significantly improved, but, she continues to have epigastric pain with intermittent nausea and no vomiting.  Epigastric pain sometimes worsens after she eats.  Apples, solid, seeds or nuts worsen her epigastric pain.  She stated having epigastric pain 75% of the time.  She takes Phenergan as needed for nausea.  She also has brief dysphagia, describes food briefly gets hung up in the upper esophagus and sometimes when she drinks water it feels like a water bubble is stuck in her esophagus.  Episodes of dysphagia occur handful times monthly.  EGD 12/16/2017 showed gastric polyps otherwise was unremarkable.  She drinks 4 to 6 ounces of bourbon nightly with her husband and occasionally goes 1 month without drinking any bourbon.  Due to her epigastric pain, she stopped drinking bourbon 1-1/2 weeks ago but continues to have fairly persistent epigastric pain.  Bowel movements are normal.  No bloody or black stools.  Colonoscopy 06/2022 identified 3 polyps removed from the colon, path report showed benign polypoid colonic mucosa with hyperplastic changes and 1 tubular adenomatous polyp.  1 polyp was not retrieved.  She was  advised to repeat a colonoscopy in 7 years.   Labs reviewed by PCP 07/09/2024: WBC 6.90.  Hemoglobin 12.5.  Hematocrit 36.8.  Platelets 252.  Sodium 140.  Potassium 4.3.  BUN 16.  Creatinine 1.1.  Total bili 0.5.  Alk phos 48.  AST 61.  ALT 67.  Albumin 4.9.       Latest Ref Rng & Units 09/05/2022    1:11 PM 09/30/2017    6:12 PM 02/12/2010    5:25 AM  CBC  WBC 4.0 - 10.5 K/uL 6.6  8.2  9.4   Hemoglobin 12.0 - 15.0 g/dL 86.4  86.2  87.7   Hematocrit 36.0 - 46.0 % 40.2  40.9  34.6   Platelets 150 - 400 K/uL 283  266  213         Latest Ref Rng & Units 09/05/2022    1:11 PM 09/30/2017    6:12 PM 02/06/2010    2:06 PM  CMP  Glucose 70 - 99 mg/dL 860  861  92   BUN 8 - 23 mg/dL 20  16  9    Creatinine 0.44 - 1.00 mg/dL 9.11  8.92  9.23   Sodium 135 - 145 mmol/L 134  138  137   Potassium 3.5 - 5.1 mmol/L 3.8  4.0  3.9   Chloride 98 - 111 mmol/L 101  105  105   CO2 22 - 32 mmol/L 20  22  26    Calcium  8.9 - 10.3 mg/dL 89.2  9.8  9.9  GI PROCEDURES:   Colonoscopy 06/22/2022: - Three 1 to 5 mm polyps in the transverse colon, in the ascending colon and in the cecum, removed with a cold snare. Resected and retrieved.  - Diverticulosis in the sigmoid colon.  - The examination was otherwise normal on direct and retroflexion views. - 7 year recall colonoscopy 1. Surgical [P], colon, ascending and cecum, polyp (2), 1 not retrieved - POLYPOID COLONIC MUCOSA WITH MILD HYPERPLASTIC CHANGES - NEGATIVE FOR DYSPLASIA 2. Surgical [P], colon, transverse, polyp (1) - TUBULAR ADENOMA(S) - NEGATIVE FOR HIGH-GRADE DYSPLASIA OR MALIGNANCY   EGD 12/16/2017: 1. Essentially normal EGD  2. Incidental diminutive gastric polyps  3. GERD 4. Noncardiac chest pain.       Past Medical History:  Diagnosis Date   Allergy      seasonal   Anxiety      takes Effexor  for hot flashes   Arthritis     Asthma      seasonal   Clotting disorder      1 DVT 5 years ago- was on Xarelto for six weeks only   GERD  (gastroesophageal reflux disease)     Hypertension     Seizures (HCC)      last seizure 2005/non-convulsive             Past Surgical History:  Procedure Laterality Date   ABDOMINAL HYSTERECTOMY   2011   BACK SURGERY   1999   COLONOSCOPY       HAND SURGERY Right 04/2022   SHOULDER SURGERY Right 11/2021   UPPER GASTROINTESTINAL ENDOSCOPY            Medications Ordered Prior to Encounter        Current Outpatient Medications on File Prior to Visit  Medication Sig Dispense Refill   albuterol (PROVENTIL HFA;VENTOLIN HFA) 108 (90 BASE) MCG/ACT inhaler Inhale 2 puffs into the lungs every 6 (six) hours as needed for wheezing or shortness of breath. Wheezing/sob       fluticasone (FLONASE) 50 MCG/ACT nasal spray Place 2 sprays into both nostrils daily as needed for allergies or rhinitis.       levETIRAcetam  (KEPPRA ) 500 MG tablet Take 500 mg by mouth 2 (two) times daily.       metFORMIN (GLUCOPHAGE) 500 MG tablet Take 500 mg by mouth 2 (two) times daily.       metoprolol  tartrate (LOPRESSOR ) 100 MG tablet Take tablet (100mg ) TWO hours prior to your cardiac CT scan. 1 tablet 0   METRONIDAZOLE, TOPICAL, 0.75 % LOTN Apply 1 Application topically 2 (two) times daily as needed (skin irritation).       olmesartan (BENICAR) 20 MG tablet Take 20 mg by mouth daily.       rosuvastatin (CRESTOR) 5 MG tablet Take 5 mg by mouth daily.       Vitamin D, Ergocalciferol, (DRISDOL) 1.25 MG (50000 UNIT) CAPS capsule Take 50,000 Units by mouth every 7 (seven) days.       Vonoprazan Fumarate (VOQUEZNA) 20 MG TABS Take by mouth.                 Current Facility-Administered Medications on File Prior to Visit  Medication Dose Route Frequency Provider Last Rate Last Admin   0.9 %  sodium chloride  infusion  500 mL Intravenous Once Abran Norleen SAILOR, MD       0.9 %  sodium chloride  infusion  500 mL Intravenous Once Abran Norleen SAILOR, MD  Allergies       Allergies  Allergen Reactions    Hydrocodone-Acetaminophen  Nausea And Vomiting   Semaglutide Nausea And Vomiting   Tramadol Hcl        Other Reaction(s): itch, felt wired   Venlafaxine         Other Reaction(s): ineffective for hot flashes   Codeine Rash      Current Medications, Allergies, Past Medical History, Past Surgical History, Family History and Social History were reviewed in [employer] record.   Review of Systems:   Constitutional: Negative for fever, sweats, chills or weight loss.  Respiratory: Negative for shortness of breath.   Cardiovascular: Negative for chest pain, palpitations and leg swelling.  Gastrointestinal: See HPI.  Musculoskeletal: Negative for back pain or muscle aches.  Neurological: Negative for dizziness, headaches or paresthesias.    Physical Exam: BP 132/60   Pulse 68   Ht 5' 5 (1.651 m)   Wt 170 lb 2 oz (77.2 kg)   BMI 28.31 kg/m    General: 63 year old female in no acute distress. Head: Normocephalic and atraumatic. Eyes: No scleral icterus. Conjunctiva pink . Ears: Normal auditory acuity. Mouth: Dentition intact. No ulcers or lesions.  Lungs: Clear throughout to auscultation. Heart: Regular rate and rhythm, no murmur. Abdomen: Soft, nondistended.  Mild epigastric tenderness without rebound or guarding.  No masses or hepatomegaly. Normal bowel sounds x 4 quadrants.  Rectal: Deferred. Musculoskeletal: Symmetrical with no gross deformities. Extremities: No edema. Neurological: Alert oriented x 4. No focal deficits.  Psychological: Alert and cooperative. Normal mood and affect   Assessment and Recommendations:   63 year old female with GERD, epigastric pain and dysphagia. Symptoms improved after she started Voquenza 6 weeks ago, however, epigastric pain persists. - EGD benefits and risks discussed including risk with sedation, risk of bleeding, perforation and infection  - RUQ sono to evaluate the gallbladder  - GERD diet - Continue Voquenza 20 mg  once daily   Nausea without vomiting  - See plan above    Hepatic steatosis. Elevated AST and ALT levels.  Abdominal MRI with and without contrast 10/30/2021 showed a right liver hemangioma versus vascular malformation and severe hepatic steatosis.  No alcohol consumption for the past 1-1/2 weeks. - Patient encouraged to reduce alcohol intake - Hepatic panel - If LFTs remain elevated will require further hepatology serologies - RUQ sonogram as ordered above - Patient encouraged to reduce carbohydrates in diet, exercise as tolerated and lose weight to reduce the scale developing advanced fatty liver disease.   History of colon polyps.  Colonoscopy 06/2022 dentified 3 polyps removed from the colon, path report showed benign polypoid colonic mucosa with hyperplastic changes and 1 tubular adenomatous polyp.  One polyp was not retrieved.   - Next colonoscopy due 06/2029

## 2024-08-27 NOTE — Progress Notes (Signed)
 Pt's states no medical or surgical changes since previsit or office visit.

## 2024-08-27 NOTE — Op Note (Signed)
 [facility] Patient Name: Melinda Wheeler Procedure Date: 08/27/2024 10:21 AM MRN: [id number] Endoscopist: Norleen SAILOR. Abran , MD, [id number] Age: 63 Referring MD:  Date of Birth: 1960-09-25 Gender: Female Account #: [id number] Procedure:                Upper GI endoscopy with balloon dilation of the                            esophagus 20 mm; with biopsies Indications:              Dysphagia, Epigastric abdominal pain, Esophageal                            reflux Medicines:                Monitored Anesthesia Care Procedure:                Pre-Anesthesia Assessment:                           - Prior to the procedure, a History and Physical                            was performed, and patient medications and                            allergies were reviewed. The patient's tolerance of                            previous anesthesia was also reviewed. The risks                            and benefits of the procedure and the sedation                            options and risks were discussed with the patient.                            All questions were answered, and informed consent                            was obtained. Prior Anticoagulants: The patient has                            taken no anticoagulant or antiplatelet agents. ASA                            Grade Assessment: II - A patient with mild systemic                            disease. After reviewing the risks and benefits,                            the patient was deemed in satisfactory condition to  undergo the procedure.                           After obtaining informed consent, the endoscope was                            passed under direct vision. Throughout the                            procedure, the patient's blood pressure, pulse, and                            oxygen saturations were monitored continuously. The                            GIF [id number] #[id number] was introduced  through the                            mouth, and advanced to the second part of duodenum.                            The upper GI endoscopy was accomplished without                            difficulty. The patient tolerated the procedure                            well. Scope In: Scope Out: Findings:                 One benign-appearing, intrinsic moderate stenosis                            was found 36 cm from the incisors. This stenosis                            measured 1.5 cm (inner diameter). A TTS dilator was                            passed through the scope. Dilation with an 18-19-20                            mm balloon dilator was performed to 20 mm. No heme.                           The exam of the esophagus was otherwise normal.                           The stomach revealed a small hiatal hernia.                            Multiple benign fundic gland type polyps. Slight  antral erythema status post biopsies to rule out H.                            pylori.                           The examined duodenum was normal.                           The cardia and gastric fundus were normal on                            retroflexion. Complications:            No immediate complications. Estimated Blood Loss:     Estimated blood loss: none. Impression:               1. GERD with mild esophageal stricture status post                            dilation                           2. Small hiatal hernia benign fundic gland type                            polyps                           3. Mild antral erythema status post biopsies                           4. Otherwise unremarkable exam. Recommendation:           1. Patient has a contact number available for                            emergencies. The signs and symptoms of potential                            delayed complications were discussed with the                            patient. Return to  normal activities tomorrow.                            Written discharge instructions were provided to the                            patient.                           2. Post dilation diet.                           3. Continue present medications.  4. Await pathology results.                           5. Keep plans for abdominal ultrasound as scheduled                           6. Office follow-up with Dr. Abran in 6 to 8 weeks Norleen SAILOR. Abran, MD 08/27/2024 10:58:43 AM This report has been signed electronically.

## 2024-08-27 NOTE — Progress Notes (Signed)
 Called to room to assist during endoscopic procedure.  Patient ID and intended procedure confirmed with present staff. Received instructions for my participation in the procedure from the performing physician.

## 2024-08-27 NOTE — Patient Instructions (Addendum)
 Follow post dilation diet. Keep plans for abdominal ultrasound as planned. Follow up in the office with Dr. Abran Wednesday February 11th at 2:40 pm  YOU HAD AN ENDOSCOPIC PROCEDURE TODAY AT THE [facility]:   Refer to the procedure report that was given to you for any specific questions about what was found during the examination.  If the procedure report does not answer your questions, please call your gastroenterologist to clarify.  If you requested that your care partner not be given the details of your procedure findings, then the procedure report has been included in a sealed envelope for you to review at your convenience later.  YOU SHOULD EXPECT: Some feelings of bloating in the abdomen. Passage of more gas than usual.  Walking can help get rid of the air that was put into your GI tract during the procedure and reduce the bloating. If you had a lower endoscopy (such as a colonoscopy or flexible sigmoidoscopy) you may notice spotting of blood in your stool or on the toilet paper. If you underwent a bowel prep for your procedure, you may not have a normal bowel movement for a few days.  Please Note:  You might notice some irritation and congestion in your nose or some drainage.  This is from the oxygen used during your procedure.  There is no need for concern and it should clear up in a day or so.  SYMPTOMS TO REPORT IMMEDIATELY:  Following upper endoscopy (EGD)  Vomiting of blood or coffee ground material  New chest pain or pain under the shoulder blades  Painful or persistently difficult swallowing  New shortness of breath  Fever of 100F or higher  Black, tarry-looking stools  For urgent or emergent issues, a gastroenterologist can be reached at any hour by calling [phone]. Do not use MyChart messaging for urgent concerns.    DIET:  We do recommend a small meal at first, but then you may proceed to your regular diet.  Drink plenty of fluids but you should avoid  alcoholic beverages for 24 hours.  ACTIVITY:  You should plan to take it easy for the rest of today and you should NOT DRIVE or use heavy machinery until tomorrow (because of the sedation medicines used during the test).    FOLLOW UP: Our staff will call the number listed on your records the next business day following your procedure.  We will call around 7:15- 8:00 am to check on you and address any questions or concerns that you may have regarding the information given to you following your procedure. If we do not reach you, we will leave a message.     If any biopsies were taken you will be contacted by phone or by letter within the next 1-3 weeks.  Please call us  at [phone] if you have not heard about the biopsies in 3 weeks.    SIGNATURES/CONFIDENTIALITY: You and/or your care partner have signed paperwork which will be entered into your electronic medical record.  These signatures attest to the fact that that the information above on your After Visit Summary has been reviewed and is understood.  Full responsibility of the confidentiality of this discharge information lies with you and/or your care-partner.

## 2024-08-28 ENCOUNTER — Telehealth: Payer: Self-pay | Admitting: *Deleted

## 2024-08-28 NOTE — Telephone Encounter (Signed)
 No answer for follow up call. Left a message.

## 2024-08-29 ENCOUNTER — Ambulatory Visit (HOSPITAL_COMMUNITY): Admission: RE | Admit: 2024-08-29 | Discharge: 2024-08-29 | Attending: Nurse Practitioner

## 2024-08-29 DIAGNOSIS — R1013 Epigastric pain: Secondary | ICD-10-CM | POA: Diagnosis present

## 2024-08-29 DIAGNOSIS — R7989 Other specified abnormal findings of blood chemistry: Secondary | ICD-10-CM | POA: Insufficient documentation

## 2024-08-30 LAB — SURGICAL PATHOLOGY

## 2024-09-05 ENCOUNTER — Ambulatory Visit: Payer: Self-pay | Admitting: Internal Medicine

## 2024-09-14 NOTE — Telephone Encounter (Signed)
 Patient returning phone call. Please advise, thank you

## 2024-09-17 ENCOUNTER — Telehealth: Payer: Self-pay | Admitting: Internal Medicine

## 2024-09-17 NOTE — Telephone Encounter (Signed)
 Call from pt stating she was returning a call from [place].  Pt requesting call back. Please advise thank you.

## 2024-09-17 NOTE — Telephone Encounter (Signed)
 See 08/22/24 lab result note for further correspondence.

## 2024-10-17 ENCOUNTER — Other Ambulatory Visit

## 2024-10-17 DIAGNOSIS — R7989 Other specified abnormal findings of blood chemistry: Secondary | ICD-10-CM | POA: Diagnosis not present

## 2024-10-17 LAB — HEPATIC FUNCTION PANEL
ALT: 56 U/L — ABNORMAL HIGH (ref 3–35)
AST: 43 U/L — ABNORMAL HIGH (ref 5–37)
Albumin: 5 g/dL (ref 3.5–5.2)
Alkaline Phosphatase: 55 U/L (ref 39–117)
Bilirubin, Direct: 0.1 mg/dL (ref 0.1–0.3)
Total Bilirubin: 0.5 mg/dL (ref 0.2–1.2)
Total Protein: 7.5 g/dL (ref 6.0–8.3)

## 2024-10-24 ENCOUNTER — Ambulatory Visit: Admitting: Internal Medicine
